# Patient Record
Sex: Female | Born: 1950
Health system: Southern US, Community
[De-identification: ages and names within clinical notes are randomized; demographics above are authoritative.]

## PROBLEM LIST (undated history)

## (undated) DIAGNOSIS — D352 Benign neoplasm of pituitary gland: Secondary | ICD-10-CM

## (undated) DIAGNOSIS — G373 Acute transverse myelitis in demyelinating disease of central nervous system: Secondary | ICD-10-CM

## (undated) DIAGNOSIS — I10 Essential (primary) hypertension: Secondary | ICD-10-CM

## (undated) DIAGNOSIS — M419 Scoliosis, unspecified: Secondary | ICD-10-CM

## (undated) DIAGNOSIS — M1990 Unspecified osteoarthritis, unspecified site: Secondary | ICD-10-CM

## (undated) DIAGNOSIS — IMO0002 Reserved for concepts with insufficient information to code with codable children: Secondary | ICD-10-CM

## (undated) DIAGNOSIS — G709 Myoneural disorder, unspecified: Secondary | ICD-10-CM

## (undated) DIAGNOSIS — M48061 Spinal stenosis, lumbar region without neurogenic claudication: Secondary | ICD-10-CM

## (undated) HISTORY — DX: Acute transverse myelitis in demyelinating disease of central nervous system: G37.3

## (undated) HISTORY — PX: ABDOMINAL HYSTERECTOMY: SHX81

## (undated) HISTORY — DX: Spinal stenosis, lumbar region without neurogenic claudication: M48.061

## (undated) HISTORY — DX: Benign neoplasm of pituitary gland: D35.2

## (undated) HISTORY — DX: Myoneural disorder, unspecified: G70.9

---

## 2002-09-09 DIAGNOSIS — G373 Acute transverse myelitis in demyelinating disease of central nervous system: Secondary | ICD-10-CM

## 2002-09-09 HISTORY — DX: Acute transverse myelitis in demyelinating disease of central nervous system: G37.3

## 2002-09-09 HISTORY — PX: TRANSPHENOIDAL PITUITARY RESECTION: SHX2572

## 2013-03-23 ENCOUNTER — Emergency Department (HOSPITAL_BASED_OUTPATIENT_CLINIC_OR_DEPARTMENT_OTHER): Payer: No Typology Code available for payment source

## 2013-03-23 ENCOUNTER — Encounter (HOSPITAL_BASED_OUTPATIENT_CLINIC_OR_DEPARTMENT_OTHER): Payer: Self-pay | Admitting: *Deleted

## 2013-03-23 ENCOUNTER — Emergency Department (HOSPITAL_BASED_OUTPATIENT_CLINIC_OR_DEPARTMENT_OTHER)
Admission: EM | Admit: 2013-03-23 | Discharge: 2013-03-23 | Disposition: A | Discharge status: Home / Self Care | Payer: No Typology Code available for payment source | Attending: Emergency Medicine | Admitting: Emergency Medicine

## 2013-03-23 DIAGNOSIS — E876 Hypokalemia: Secondary | ICD-10-CM | POA: Insufficient documentation

## 2013-03-23 DIAGNOSIS — R0789 Other chest pain: Secondary | ICD-10-CM | POA: Insufficient documentation

## 2013-03-23 DIAGNOSIS — Z79899 Other long term (current) drug therapy: Secondary | ICD-10-CM | POA: Insufficient documentation

## 2013-03-23 DIAGNOSIS — H539 Unspecified visual disturbance: Secondary | ICD-10-CM | POA: Insufficient documentation

## 2013-03-23 DIAGNOSIS — R0602 Shortness of breath: Secondary | ICD-10-CM | POA: Insufficient documentation

## 2013-03-23 DIAGNOSIS — I10 Essential (primary) hypertension: Secondary | ICD-10-CM | POA: Insufficient documentation

## 2013-03-23 DIAGNOSIS — Z8739 Personal history of other diseases of the musculoskeletal system and connective tissue: Secondary | ICD-10-CM | POA: Insufficient documentation

## 2013-03-23 HISTORY — DX: Essential (primary) hypertension: I10

## 2013-03-23 HISTORY — DX: Reserved for concepts with insufficient information to code with codable children: IMO0002

## 2013-03-23 HISTORY — DX: Scoliosis, unspecified: M41.9

## 2013-03-23 HISTORY — DX: Unspecified osteoarthritis, unspecified site: M19.90

## 2013-03-23 LAB — BASIC METABOLIC PANEL
Chloride: 104 mEq/L (ref 96–112)
GFR calc Af Amer: 68 mL/min — ABNORMAL LOW (ref >90)
GFR calc non Af Amer: 59 mL/min — ABNORMAL LOW (ref >90)
Potassium: 3.4 mEq/L — ABNORMAL LOW (ref 3.5–5.1)
Sodium: 144 mEq/L (ref 135–145)

## 2013-03-23 LAB — CBC WITH DIFFERENTIAL/PLATELET
Basophils Absolute: 0 10*3/uL (ref 0.0–0.1)
Basophils Relative: 1 % (ref 0–1)
Eosinophils Absolute: 0.2 10*3/uL (ref 0.0–0.7)
Hemoglobin: 12.6 g/dL (ref 12.0–15.0)
MCH: 29.5 pg (ref 26.0–34.0)
MCHC: 32 g/dL (ref 30.0–36.0)
Neutro Abs: 2.6 10*3/uL (ref 1.7–7.7)
Neutrophils Relative %: 43 % (ref 43–77)
Platelets: 204 10*3/uL (ref 150–400)
RDW: 13.3 % (ref 11.5–15.5)

## 2013-03-23 LAB — TROPONIN I: Troponin I: <0.3 ng/mL (ref <0.30)

## 2013-03-23 MED ORDER — ATENOLOL 25 MG PO TABS
50.0000 mg | ORAL_TABLET | Freq: Once | ORAL | Status: AC
  Administered 2013-03-23: 50 mg via ORAL
  Filled 2013-03-23: qty 2

## 2013-03-23 MED ORDER — POTASSIUM CHLORIDE CRYS ER 20 MEQ PO TBCR
40.0000 meq | EXTENDED_RELEASE_TABLET | Freq: Once | ORAL | Status: AC
  Administered 2013-03-23: 40 meq via ORAL
  Filled 2013-03-23: qty 2

## 2013-03-23 MED ORDER — ATENOLOL 50 MG PO TABS: 50.0000 mg | ORAL_TABLET | Freq: Every day | ORAL | Status: DC

## 2013-03-23 NOTE — ED Provider Notes (Signed)
History    CSN: [id number] Arrival date & time 03/23/13  1146  First MD Initiated Contact with Patient 03/23/13 1229     Chief Complaint  Patient presents with  . Hypertension   (Consider location/radiation/quality/duration/timing/severity/associated sxs/prior Treatment) HPI Comments: 62 y.o. Female with PMHx of HTN presents today with hypertension. She has been well-controlled with Atenolol, but has been out of her meds about a week. She has been "borrowing from a friend," as she has not been under a PCP for over a year due to insurance issues. Pt states she became worried when last night she began feeling some chest tightness, central chest, non radiating accompanied with some shortness of breath. She admits seeing what she describes as black floaters which worried her as well. These sx she describes from last night have since resolved. Pt denies headache, numbness, or focal deficits.   Patient is a 62 y.o. female presenting with hypertension.  Hypertension Associated symptoms include chest pain. Pertinent negatives include no diaphoresis, fever, headaches, nausea, neck pain, numbness, rash, vomiting or weakness.   Past Medical History  Diagnosis Date  . Hypertension   . Arthritis   . Bulging disc   . Scoliosis    History reviewed. No pertinent past surgical history. History reviewed. No pertinent family history. History  Substance Use Topics  . Smoking status: Never Smoker   . Smokeless tobacco: Not on file  . Alcohol Use: No   OB History   Grav Para Term Preterm Abortions TAB SAB Ect Mult Living                 Review of Systems  Constitutional: Negative for fever and diaphoresis.  HENT: Negative for neck pain and neck stiffness.   Eyes: Positive for visual disturbance.       Black floaters  Respiratory: Positive for shortness of breath. Negative for apnea and chest tightness.   Cardiovascular: Positive for chest pain. Negative for palpitations.  Gastrointestinal:  Negative for nausea, vomiting, diarrhea and constipation.  Genitourinary: Negative for dysuria.  Musculoskeletal: Negative for gait problem.  Skin: Negative for rash.  Neurological: Negative for dizziness, weakness, light-headedness, numbness and headaches.    Allergies  Review of patient's allergies indicates no known allergies.  Home Medications   Current Outpatient Rx  Name  Route  Sig  Dispense  Refill  . atenolol (TENORMIN) 50 MG tablet   Oral   Take 50 mg by mouth daily.          BP 196/89  Pulse 96  Temp(Src) 98.8 F (37.1 C) (Oral)  Resp 20  SpO2 96% Physical Exam  Nursing note and vitals reviewed. Constitutional: She is oriented to person, place, and time. She appears well-developed and well-nourished. No distress.  HENT:  Head: Normocephalic and atraumatic.  Eyes: Conjunctivae and EOM are normal.  Neck: Normal range of motion. Neck supple.  No meningeal signs  Cardiovascular: Normal rate, regular rhythm and normal heart sounds.  Exam reveals no gallop and no friction rub.   No murmur heard. Pulmonary/Chest: Effort normal and breath sounds normal. No respiratory distress. She has no wheezes. She has no rales. She exhibits no tenderness.  Abdominal: Soft. Bowel sounds are normal. She exhibits no distension. There is no tenderness. There is no rebound and no guarding.  Musculoskeletal: Normal range of motion. She exhibits no edema and no tenderness.  FROM to upper and lower extremities No step-offs noted on C-spine No tenderness to palpation of the spinous processes of the  C-spine, T-spine or L-spine Full range of motion of C-spine, T-spine or L-spine Mild tenderness to palpation of the paraspinous muscles   Neurological: She is alert and oriented to person, place, and time. No cranial nerve deficit.  Speech is clear and goal oriented, follows commands Sensation normal to light touch and two point discrimination Moves extremities without ataxia, coordination  intact Normal gait and balance Normal strength in upper and lower extremities bilaterally including dorsiflexion and plantar flexion, strong and equal grip strength   Skin: Skin is warm and dry. She is not diaphoretic. No erythema.    ED Course  Procedures (including critical care time)   Date: 03/23/2013  Rate: 71  Rhythm: normal sinus rhythm  QRS Axis: normal  Intervals: possible left atrial enlargement  ST/T Wave abnormalities: normal  Conduction Disutrbances:none  Narrative Interpretation:   Old EKG Reviewed: none available   Labs Reviewed  BASIC METABOLIC PANEL - Abnormal; Notable for the following:    Potassium 3.4 (*)    Glucose, Bld 105 (*)    Calcium 11.0 (*)    GFR calc non Af Amer 59 (*)    GFR calc Af Amer 68 (*)    All other components within normal limits  TROPONIN I  CBC WITH DIFFERENTIAL   No results found. 1. Hypertension   2. Hypokalemia    Filed Vitals:   03/23/13 1202 03/23/13 1430  BP: 196/89 177/74  Pulse: 96 59  Temp: 98.8 F (37.1 C)   TempSrc: Oral   Resp: 20 20  SpO2: 96% 99%     MDM  Patient noted to be hypertensive in the emergency department.  Atenolol given and BP has been coming down incrementally. No signs of hypertensive urgency.  PE is benign, neuro exam is normal, lungs CTA, equal full expansion. EKG without acute abnormalities, negative troponin, and negative CXR. Labs revealed hypokalemia and the need for follow up was discussed with the pt. Discussed with pt that presentation of symptoms, tests and imaging performed today are reassuring to rule out acute coronary syndrome, pneumothorax, aortic dissection.   Prescription for atenolol written and impressed upon the pt the importance of having a physical and establishing herself with a primary care physician. Pt stated that she was in the middle of insurance issues and was planning on doing just that. Return precautions discussed including if CP becomes exertional, associated with  diaphoresis or nausea, radiates to left jaw/arm, worsens or becomes concerning in any way. Patient expresses understanding and agrees with plan.     Glade Nurse, PA-C 03/23/13 1549

## 2013-03-23 NOTE — ED Notes (Signed)
Blood pressure up today  Pt states she thought she would mention she has a pain behind her left ear that has been there for years just hasnt had checked out

## 2013-03-24 NOTE — ED Provider Notes (Signed)
Medical screening examination/treatment/procedure(s) were performed by non-physician practitioner and as supervising physician I was immediately available for consultation/collaboration.  Ladarryl Wrage, MD 03/24/13 1449 

## 2013-06-03 ENCOUNTER — Other Ambulatory Visit: Payer: Self-pay | Admitting: *Deleted

## 2013-06-07 ENCOUNTER — Ambulatory Visit (INDEPENDENT_AMBULATORY_CARE_PROVIDER_SITE_OTHER): Payer: No Typology Code available for payment source | Admitting: Internal Medicine

## 2013-06-07 ENCOUNTER — Encounter: Payer: Self-pay | Admitting: Internal Medicine

## 2013-06-07 ENCOUNTER — Telehealth: Payer: Self-pay | Admitting: *Deleted

## 2013-06-07 VITALS — BP 212/110 | HR 86 | Temp 97.5°F | Resp 20 | Wt 308.0 lb

## 2013-06-07 DIAGNOSIS — M48061 Spinal stenosis, lumbar region without neurogenic claudication: Secondary | ICD-10-CM

## 2013-06-07 DIAGNOSIS — G0489 Other myelitis: Secondary | ICD-10-CM

## 2013-06-07 DIAGNOSIS — I10 Essential (primary) hypertension: Secondary | ICD-10-CM

## 2013-06-07 DIAGNOSIS — Z23 Encounter for immunization: Secondary | ICD-10-CM

## 2013-06-07 DIAGNOSIS — Z90710 Acquired absence of both cervix and uterus: Secondary | ICD-10-CM

## 2013-06-07 DIAGNOSIS — G373 Acute transverse myelitis in demyelinating disease of central nervous system: Secondary | ICD-10-CM | POA: Insufficient documentation

## 2013-06-07 DIAGNOSIS — Z86711 Personal history of pulmonary embolism: Secondary | ICD-10-CM

## 2013-06-07 LAB — CBC WITH DIFFERENTIAL/PLATELET
Basophils Absolute: 0 10*3/uL (ref 0.0–0.1)
Basophils Relative: 0 % (ref 0–1)
HCT: 38.1 % (ref 36.0–46.0)
Hemoglobin: 12.7 g/dL (ref 12.0–15.0)
Lymphocytes Relative: 36 % (ref 12–46)
MCHC: 33.3 g/dL (ref 30.0–36.0)
Monocytes Absolute: 0.6 10*3/uL (ref 0.1–1.0)
Monocytes Relative: 12 % (ref 3–12)
Neutro Abs: 2.6 10*3/uL (ref 1.7–7.7)
Neutrophils Relative %: 49 % (ref 43–77)
WBC: 5.3 10*3/uL (ref 4.0–10.5)

## 2013-06-07 LAB — LIPID PANEL
Cholesterol: 240 mg/dL — ABNORMAL HIGH (ref 0–200)
HDL: 49 mg/dL (ref >39)
LDL Cholesterol: 169 mg/dL — ABNORMAL HIGH (ref 0–99)
Triglycerides: 109 mg/dL (ref <150)
VLDL: 22 mg/dL (ref 0–40)

## 2013-06-07 MED ORDER — CLONIDINE HCL 0.1 MG PO TABS
0.1000 mg | ORAL_TABLET | Freq: Once | ORAL | Status: AC
  Administered 2013-06-07: 0.1 mg via ORAL

## 2013-06-07 MED ORDER — LOSARTAN POTASSIUM-HCTZ 100-25 MG PO TABS: 1.0000 | ORAL_TABLET | Freq: Every day | ORAL | Status: DC

## 2013-06-07 MED ORDER — NABUMETONE 500 MG PO TABS: 500.0000 mg | ORAL_TABLET | Freq: Two times a day (BID) | ORAL | Status: DC

## 2013-06-07 NOTE — Patient Instructions (Addendum)
Take blood pressure  Pill every day  (Hyzaar)  It has a diuretic in it  See me in 2 weeks  30 min appt     Will refer to neurology  Call us back with the name of neurologist who you have seen

## 2013-06-07 NOTE — Telephone Encounter (Signed)
Pt called with name of neurologist Barnett Hatter.

## 2013-06-07 NOTE — Progress Notes (Signed)
Subjective:    Patient ID: Melanie Orr, female    DOB: 11/20/1950, 62 y.o.   MRN: [id number]  HPI Melanie Orr is here new pt for first visit.   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    PMH of long standing HTN,  DJD back and neck, spinal stenosis  Uterine prolapse S/P hysterectomy,  PE S/P venous filter, morbid obesity,  She also give history of transverse myelitis and has seen a neurologist in [place] for this.  She cannot recall name.  She does have chronic back issues and has received steroid injections in the past.    She was seen in ER back in July and given atenolol for her BP.  She has run out of meds for at least one week.  She has occasional dizziness and pain in neck.  NO chest pain no SOB.  She does "hold fluid in my legs"  See calcium level    No Known Allergies Past Medical History  Diagnosis Date  . Hypertension   . Arthritis   . Bulging disc   . Scoliosis    No past surgical history on file. History   Social History  . Marital Status: Single    Spouse Name: N/A    Number of Children: N/A  . Years of Education: N/A   Occupational History  . Not on file.   Social History Main Topics  . Smoking status: Never Smoker   . Smokeless tobacco: Not on file  . Alcohol Use: No  . Drug Use: No  . Sexual Activity: Not on file   Other Topics Concern  . Not on file   Social History Narrative  . No narrative on file   No family history on file. Patient Active Problem List   Diagnosis Date Noted  . HTN (hypertension) 06/07/2013   Current Outpatient Prescriptions on File Prior to Visit  Medication Sig Dispense Refill  . atenolol (TENORMIN) 50 MG tablet Take 50 mg by mouth daily.      Marland Kitchen atenolol (TENORMIN) 50 MG tablet Take 1 tablet (50 mg total) by mouth daily.  30 tablet  1   No current facility-administered medications on file prior to visit.     Review of Systems    see HPI Objective:   Physical Exam Physical Exam  Nursing note and vitals reviewed.    REpeat BP  186/100 after clonidine Constitutional: She is oriented to person, place, and time. She appears well-developed and well-nourished.  HENT:  Head: Normocephalic and atraumatic.  Cardiovascular: Normal rate and regular rhythm. Exam reveals no gallop and no friction rub.  No murmur heard.  Pulmonary/Chest: Breath sounds normal. She has no wheezes. She has no rales.  Neurological: She is alert and oriented to person, place, and time.  Skin: Skin is warm and dry.  EXt  1+ edema extremities Psychiatric: She has a normal mood and affect. Her behavior is normal.          Assessment & Plan:  HTN uncontrolled:  Clonidine 0.1 mg in office and will start on Hyzaar 100/25 daily.  Hold atenolol for now  Spinal stenosis/DJD/??? History of transverse myelitis    I have asked pt to check records at home to see which neurologist she has seen in the past and we will refer her back to the group.  Ok for Relafen bid for now  Elevated Calcium  Will rehceck today with PTH  History  of PE  Will need old records  Morbid obesity  See me in  2 weeks

## 2013-06-08 LAB — PTH, INTACT AND CALCIUM
Calcium: 9.2 mg/dL (ref 8.4–10.5)
PTH: 67.7 pg/mL (ref 14.0–72.0)

## 2013-06-21 ENCOUNTER — Encounter: Payer: Self-pay | Admitting: Internal Medicine

## 2013-06-21 ENCOUNTER — Ambulatory Visit (INDEPENDENT_AMBULATORY_CARE_PROVIDER_SITE_OTHER): Payer: No Typology Code available for payment source | Admitting: Internal Medicine

## 2013-06-21 VITALS — BP 192/98 | HR 79 | Temp 97.4°F | Resp 18 | Wt 306.0 lb

## 2013-06-21 DIAGNOSIS — G373 Acute transverse myelitis in demyelinating disease of central nervous system: Secondary | ICD-10-CM

## 2013-06-21 DIAGNOSIS — M549 Dorsalgia, unspecified: Secondary | ICD-10-CM

## 2013-06-21 DIAGNOSIS — E785 Hyperlipidemia, unspecified: Secondary | ICD-10-CM

## 2013-06-21 DIAGNOSIS — R3989 Other symptoms and signs involving the genitourinary system: Secondary | ICD-10-CM

## 2013-06-21 DIAGNOSIS — R399 Unspecified symptoms and signs involving the genitourinary system: Secondary | ICD-10-CM

## 2013-06-21 DIAGNOSIS — I10 Essential (primary) hypertension: Secondary | ICD-10-CM

## 2013-06-21 LAB — COMPREHENSIVE METABOLIC PANEL
ALT: 16 U/L (ref 0–35)
AST: 19 U/L (ref 0–37)
Albumin: 3.8 g/dL (ref 3.5–5.2)
Alkaline Phosphatase: 62 U/L (ref 39–117)
Calcium: 9.9 mg/dL (ref 8.4–10.5)
Chloride: 103 mEq/L (ref 96–112)
Potassium: 3.9 mEq/L (ref 3.5–5.3)
Sodium: 142 mEq/L (ref 135–145)
Total Protein: 6.5 g/dL (ref 6.0–8.3)

## 2013-06-21 LAB — POCT URINALYSIS DIPSTICK
Bilirubin, UA: NEGATIVE
Glucose, UA: NEGATIVE
Nitrite, UA: NEGATIVE
Urobilinogen, UA: NEGATIVE

## 2013-06-21 MED ORDER — LABETALOL HCL 100 MG PO TABS: 100.0000 mg | ORAL_TABLET | Freq: Two times a day (BID) | ORAL | Status: DC

## 2013-06-21 MED ORDER — ACETAMINOPHEN-CODEINE #3 300-30 MG PO TABS: ORAL_TABLET | ORAL | Status: DC

## 2013-06-21 NOTE — Patient Instructions (Addendum)
Take your Hyzaar and labetalol every  morning  Take your labetalol 100 mg every evening  Will refer you to Dr. Mariel Craft  And a medical neurologist  See me next Thursday 30 min appt

## 2013-06-22 ENCOUNTER — Encounter: Payer: Self-pay | Admitting: *Deleted

## 2013-06-27 DIAGNOSIS — E785 Hyperlipidemia, unspecified: Secondary | ICD-10-CM | POA: Insufficient documentation

## 2013-06-27 NOTE — Progress Notes (Signed)
Subjective:    Patient ID: Melanie Orr, female    DOB: Aug 10, 1951, 62 y.o.   MRN: [id number]  HPI  Jerney is here for follow up.  She is here with her daughter.  See BP  She tells me she is taking her Hyzaar but she notes she is not urinating very much despite the HCTZ .     She tells me she is having worsening back pain from her past history of spinal stenosis and transverse myelitis.   Relafen not helping much She knows she has seen Dr. Mariel Craft in the past.    She is walking with a walker today.  She denies leg weakness or bowel/bladder incontinence  Hypercalcemia  Repeat CAlcium and PTH are both normal  Hyperlipidemia  She has not been on much for this  She has been having urinary frequency  No pain   No Known Allergies Past Medical History  Diagnosis Date  . Hypertension   . Arthritis   . Bulging disc   . Scoliosis   . Neuromuscular disorder   . Transverse myelitis    Past Surgical History  Procedure Laterality Date  . Tumor removal    . Abdominal hysterectomy     History   Social History  . Marital Status: Single    Spouse Name: N/A    Number of Children: N/A  . Years of Education: N/A   Occupational History  . Not on file.   Social History Main Topics  . Smoking status: Former Smoker    Quit date: 06/08/1979  . Smokeless tobacco: Never Used  . Alcohol Use: No  . Drug Use: No  . Sexual Activity: No   Other Topics Concern  . Not on file   Social History Narrative  . No narrative on file   Family History  Problem Relation Age of Onset  . Hypertension Mother   . Heart disease Mother   . Hypertension Sister   . Hypertension Brother    Patient Active Problem List   Diagnosis Date Noted  . HTN (hypertension) 06/07/2013  . Hypercalcemia 06/07/2013  . Transverse myelitis 06/07/2013  . S/P hysterectomy 06/07/2013  . History of pulmonary embolus (PE) 06/07/2013  . Spinal stenosis of lumbar region 06/07/2013  . Morbid obesity 06/07/2013    Current Outpatient Prescriptions on File Prior to Visit  Medication Sig Dispense Refill  . losartan-hydrochlorothiazide (HYZAAR) 100-25 MG per tablet Take 1 tablet by mouth daily.  30 tablet  3  . nabumetone (RELAFEN) 500 MG tablet Take 1 tablet (500 mg total) by mouth 2 (two) times daily.  30 tablet  1   No current facility-administered medications on file prior to visit.       Review of Systems See HPI    Objective:   Physical Exam Physical Exam  Nursing note and vitals reviewed.  Constitutional: She is oriented to person, place, and time. She appears well-developed and well-nourished.  HENT:  Head: Normocephalic and atraumatic.  Cardiovascular: Normal rate and regular rhythm. Exam reveals no gallop and no friction rub.  No murmur heard.  Pulmonary/Chest: Breath sounds normal. She has no wheezes. She has no rales.  Neurological: She is alert and oriented to person, place, and time.  Limted exam of LE  Reflexes 2+ symmetric Motor  5/5 muscle groups LE Sensory intact to microfilament LE Skin: Skin is warm and dry.  Psychiatric: She has a normal mood and affect. Her behavior is normal.  Assessment & Plan:  HTN  EKG today  Nonspecific ST-T changes will add labetalol 100 bid   She is to see me next week/  If BP still not controlled will need stronger diuretic  Back pain  Spinal stenosis/history of transverse myelitis  Will stop relafen and take T#3 1 or two q6h   ADvised colace and miralax for constipation.  She has seen Dr. Mariel Craft in the past will refer to him  Hyperlipidemia  Will discuss treatment when BP under better control  Urinary frequency  Normal U/A today  Morbid obesity     See  Me in one week

## 2013-06-30 ENCOUNTER — Ambulatory Visit (INDEPENDENT_AMBULATORY_CARE_PROVIDER_SITE_OTHER): Payer: No Typology Code available for payment source | Admitting: Internal Medicine

## 2013-06-30 ENCOUNTER — Encounter: Payer: Self-pay | Admitting: Internal Medicine

## 2013-06-30 VITALS — BP 185/89 | HR 81 | Temp 98.1°F | Resp 20 | Wt 306.0 lb

## 2013-06-30 DIAGNOSIS — I10 Essential (primary) hypertension: Secondary | ICD-10-CM

## 2013-06-30 DIAGNOSIS — G0489 Other myelitis: Secondary | ICD-10-CM

## 2013-06-30 DIAGNOSIS — M48061 Spinal stenosis, lumbar region without neurogenic claudication: Secondary | ICD-10-CM

## 2013-06-30 DIAGNOSIS — G373 Acute transverse myelitis in demyelinating disease of central nervous system: Secondary | ICD-10-CM

## 2013-06-30 NOTE — Patient Instructions (Signed)
See me 6 weeks  Schedule CPE  Will refer to neurology

## 2013-06-30 NOTE — Progress Notes (Signed)
Subjective:    Patient ID: Melanie Orr, female    DOB: Sep 22, 1950, 62 y.o.   MRN: [id number]  HPI  Melanie Orr is here for follow up  Unfortunately  She has missed several days of her BP meds.  A relative has her purse that has her meds in it.  She is asymptomatic.    She has a history of transverse myelitis and spinal stenosis and was hospitalized at [facility] 5-6 years ago.  She cannot recall what neurologist cared for her.  She does have back discomfort off an on  No Known Allergies Past Medical History  Diagnosis Date  . Hypertension   . Arthritis   . Bulging disc   . Scoliosis   . Neuromuscular disorder   . Transverse myelitis    Past Surgical History  Procedure Laterality Date  . Tumor removal    . Abdominal hysterectomy     History   Social History  . Marital Status: Single    Spouse Name: N/A    Number of Children: N/A  . Years of Education: N/A   Occupational History  . Not on file.   Social History Main Topics  . Smoking status: Former Smoker    Quit date: 06/08/1979  . Smokeless tobacco: Never Used  . Alcohol Use: No  . Drug Use: No  . Sexual Activity: No   Other Topics Concern  . Not on file   Social History Narrative  . No narrative on file   Family History  Problem Relation Age of Onset  . Hypertension Mother   . Heart disease Mother   . Hypertension Sister   . Hypertension Brother    Patient Active Problem List   Diagnosis Date Noted  . Hyperlipidemia 06/27/2013  . HTN (hypertension) 06/07/2013  . Hypercalcemia 06/07/2013  . Transverse myelitis 06/07/2013  . S/P hysterectomy 06/07/2013  . History of pulmonary embolus (PE) 06/07/2013  . Spinal stenosis of lumbar region 06/07/2013  . Morbid obesity 06/07/2013   Current Outpatient Prescriptions on File Prior to Visit  Medication Sig Dispense Refill  . acetaminophen-codeine (TYLENOL #3) 300-30 MG per tablet Take one or two tablets q6h prn pain  30 tablet  0  .  losartan-hydrochlorothiazide (HYZAAR) 100-25 MG per tablet Take 1 tablet by mouth daily.  30 tablet  3  . labetalol (NORMODYNE) 100 MG tablet Take 1 tablet (100 mg total) by mouth 2 (two) times daily.  60 tablet  1   No current facility-administered medications on file prior to visit.      Review of Systems See HPI    Objective:   Physical Exam  Physical Exam  Nursing note and vitals reviewed.  Constitutional: She is oriented to person, place, and time. She appears well-developed and well-nourished.  HENT:  Head: Normocephalic and atraumatic.  Cardiovascular: Normal rate and regular rhythm. Exam reveals no gallop and no friction rub.  No murmur heard.  Pulmonary/Chest: Breath sounds normal. She has no wheezes. She has no rales.  Neurological: She is alert and oriented to person, place, and time.  Skin: Skin is warm and dry.  Psychiatric: She has a normal mood and affect. Her behavior is normal.         Assessment & Plan:  HTN:  Uncontrolled  But pt has not had her meds for several days.    She is to see me in 4-6 weeks.  Schedule CPe  Hyperlipidemia:  She does not want meds at this time.  Will discuss  At future visit   History of transverse myelitis/spinal stenosis :  Will refer to neurology  See me 4-6 weeks

## 2013-07-04 ENCOUNTER — Encounter: Payer: Self-pay | Admitting: Internal Medicine

## 2013-07-04 DIAGNOSIS — Z87898 Personal history of other specified conditions: Secondary | ICD-10-CM | POA: Insufficient documentation

## 2013-07-04 DIAGNOSIS — G4733 Obstructive sleep apnea (adult) (pediatric): Secondary | ICD-10-CM | POA: Insufficient documentation

## 2013-07-04 DIAGNOSIS — K219 Gastro-esophageal reflux disease without esophagitis: Secondary | ICD-10-CM | POA: Insufficient documentation

## 2013-07-04 DIAGNOSIS — Z86718 Personal history of other venous thrombosis and embolism: Secondary | ICD-10-CM | POA: Insufficient documentation

## 2013-07-08 ENCOUNTER — Encounter: Payer: Self-pay | Admitting: *Deleted

## 2013-07-14 ENCOUNTER — Ambulatory Visit: Payer: No Typology Code available for payment source | Admitting: Internal Medicine

## 2013-07-26 ENCOUNTER — Encounter: Payer: Self-pay | Admitting: Neurology

## 2013-07-26 ENCOUNTER — Ambulatory Visit: Payer: No Typology Code available for payment source | Admitting: Neurology

## 2013-08-09 ENCOUNTER — Ambulatory Visit: Payer: No Typology Code available for payment source | Admitting: Internal Medicine

## 2013-08-09 DIAGNOSIS — Z09 Encounter for follow-up examination after completed treatment for conditions other than malignant neoplasm: Secondary | ICD-10-CM

## 2013-08-23 ENCOUNTER — Encounter: Payer: Self-pay | Admitting: Neurology

## 2013-08-23 ENCOUNTER — Ambulatory Visit (INDEPENDENT_AMBULATORY_CARE_PROVIDER_SITE_OTHER): Payer: No Typology Code available for payment source | Admitting: Neurology

## 2013-08-23 VITALS — BP 130/80 | HR 80 | Temp 97.8°F | Ht 67.0 in | Wt 300.0 lb

## 2013-08-23 DIAGNOSIS — M48062 Spinal stenosis, lumbar region with neurogenic claudication: Secondary | ICD-10-CM

## 2013-08-23 DIAGNOSIS — G373 Acute transverse myelitis in demyelinating disease of central nervous system: Secondary | ICD-10-CM

## 2013-08-23 DIAGNOSIS — G0489 Other myelitis: Secondary | ICD-10-CM

## 2013-08-23 MED ORDER — METHOCARBAMOL 500 MG PO TABS: 500.0000 mg | ORAL_TABLET | Freq: Three times a day (TID) | ORAL | Status: DC | PRN

## 2013-08-23 NOTE — Patient Instructions (Addendum)
1.  MRI thoracic and lumbar spine wwo contrast December 24,2014 at [facility] [address][id number] 2.  Start taking robaxin 500mg  take one tablet at bedtime.  If tolerating, you can take one tablet every 8 hours as needed for muscle spasms 3.  Return to clinic 92-month

## 2013-08-23 NOTE — Progress Notes (Signed)
[facility] Neurology Division Clinic Note - Initial Visit   Date: 08/23/2013    Melanie Orr MRN: [id number] DOB: 1951/04/26   Dear Dr Constance Goltz:  Thank you for your kind referral of Melanie Orr for consultation of transverse myelitis. Although her history is well known to you, please allow [place] to reiterate it for the purpose of our medical record. The patient was accompanied to the clinic by daughter.   History of Present Illness: Melanie Orr is a 61 y.o. year-old right-handed [place] American female with history of hypertension, transverse myelitis, situational PE s/p IVC filter, lumbar spinal stenosis, pituitary mass s/p resection, presenting for evaluation of history of transverse myelitis.  She had been without health insurance for a number of years and in September 2014, she established care with Dr. Constance Goltz.  During that evaluation, she had mentioned history of spinal stensis and transverse myelitis for which she was referred to neurology.  In 2004, she woke up one morning with generalized weakness involving the right side.  She eventually became plegic with her right leg, developed bowel/bladder incontinence, and numbness over the right leg.  She underwent imaging and CSF testing which was consistent transverse myelitis. No underlying etiology was found.  She was discharged to rehab where she developed a new headache.  MRI brain shows pituitary ademoma which she had removed at [facility]. She also developed a situational DVT with PE s/p IVC filter and was anticoagulation.  She was discharged from rehab in a wheelchair and completed out-patient therapy where she was able to eventually walk with a cane within a year.   She started developing low back pain soon after regaining the ability to ambulate independently.  Pain is described tightness at rest and transitions into a sharp, achy pain when she tried to walk and prolonged standing. Denies any  radiation of her pain.  Pain is relieved when sitting.  She says that leaning onto things, such as a shopping cart, completely resolves the pain. Even when she is washing dishes, she has to lean on her elbows to minimize the pain. In addition to low back pain, she complains of lower extremity muscle spasms. She has seen Dr. Darden Palmer and another neurologist in [facility] for back pain.  She has tried flexiril and percocet for pain with minimal relief.  She has also spinal injections which helped for one week.  About 2-years ago, she started using a walker. She denies any recent falls. No numbness/tingling or weakness.  There is no history of acute vision loss.  Out-side paper records, electronic medical record, and images have been reviewed where available and summarized as:  MRI brain November 20 2002:  There is a mass arising from the sella with apparent extension of tumor into the cavernous sinus on the right. MRI brain 12/14/02: Pituitary mass extending superset and into the right cavernous sinus. Mass appears to increase in size since 11/20/2002.  Lab Results  Component Value Date   TSH 0.431 06/07/2013     Past Medical History  Diagnosis Date  . Hypertension   . Arthritis   . Bulging disc   . Scoliosis   . Neuromuscular disorder   . Transverse myelitis 2004  . Spinal stenosis of lumbar region   . Pituitary adenoma     Past Surgical History  Procedure Laterality Date  . Transphenoidal pituitary resection  2004  . Abdominal hysterectomy       Medications:  Current Outpatient Prescriptions on File Prior to Visit  Medication Sig Dispense Refill  . losartan-hydrochlorothiazide (HYZAAR) 100-25 MG per tablet Take 1 tablet by mouth daily.  30 tablet  3   No current facility-administered medications on file prior to visit.    Allergies:  Allergies  Allergen Reactions  . Penicillins Itching    Family History: Family History  Problem Relation Age of Onset  . Hypertension Mother     . Heart disease Mother     Deceased, 4  . Hypertension Sister   . Hypertension Brother   . Healthy Daughter   . Other Father     Deceased, 18s    Social History: History   Social History  . Marital Status: Single    Spouse Name: N/A    Number of Children: N/A  . Years of Education: N/A   Occupational History  . Not on file.   Social History Main Topics  . Smoking status: Former Smoker    Quit date: 06/08/1979  . Smokeless tobacco: Never Used  . Alcohol Use: Yes     Comment: Occasionally drink wine  . Drug Use: No  . Sexual Activity: No   Other Topics Concern  . Not on file   Social History Narrative   Lives with daughter in a [place] home.   Single.  Two children.   Early retirement due to back problems in 2012.     Previously worked as a Science writer for [employer].    Review of Systems:  CONSTITUTIONAL: No fevers, chills, night sweats, orweight loss.   EYES: No visual changes or eye pain ENT: No hearing changes.  No history of nose bleeds.   RESPIRATORY: No cough, wheezing and shortness of breath.   CARDIOVASCULAR: Negative for chest pain, and palpitations.   GI: Negative for abdominal discomfort, blood in stools or black stools.  No recent change in bowel habits.   GU:  No history of incontinence.   MUSCLOSKELETAL: No history of joint pain or swelling.  + myalgias.   SKIN: Negative for lesions, rash, and itching.   HEMATOLOGY/ONCOLOGY: Negative for prolonged bleeding, bruising easily, and swollen nodes.   ENDOCRINE: Negative for cold or heat intolerance, polydipsia or goiter.   PSYCH:  No depression or anxiety symptoms.   NEURO: As Above.   Vital Signs:  BP 130/80  Pulse 80  Temp(Src) 97.8 F (36.6 C)  Ht 5\' 7"  (1.702 m)  Wt 300 lb (136.079 kg)  BMI 46.98 kg/m2   General Medical Exam:   General:  Obese, well appearing, comfortable.   Eyes/ENT: see cranial nerve examination.   Neck: No masses appreciated.  Full range of motion  without tenderness.   Back:  No pain to palpation of spinous processes.   Extremities:  No deformities, edema, or skin discoloration.   Skin:  Skin color, texture, turgor normal. No rashes or lesions.  Neurological Exam: MENTAL STATUS including orientation to time, place, person, recent and remote memory, attention span and concentration, language, and fund of knowledge is normal.  Speech is not dysarthric.  CRANIAL NERVES: II:  No visual field defects.  Unremarkable fundi.   III-IV-VI: Pupils equal round and reactive to light.  Normal conjugate, extra-ocular eye movements in all directions of gaze.  No nystagmus.  No ptosis V:  Normal facial sensation.    VII:  Normal facial symmetry and movements.   VIII:  Normal hearing and vestibular function.   IX-X:  Normal palatal movement.   XI:  Normal shoulder shrug and head rotation.  XII:  Normal tongue strength and range of motion, no deviation or fasciculation.  MOTOR:  No atrophy, fasciculations or abnormal movements.  No pronator drift.    Right Upper Extremity:    Left Upper Extremity:    Deltoid  5/5   Deltoid  5/5   Biceps  5/5   Biceps  5/5   Triceps  5/5   Triceps  5/5   Wrist extensors  5/5   Wrist extensors  5/5   Wrist flexors  5/5   Wrist flexors  5/5   Finger extensors  5/5   Finger extensors  5/5   Finger flexors  5/5   Finger flexors  5/5   Dorsal interossei  5/5   Dorsal interossei  5/5   Abductor pollicis  5/5   Abductor pollicis  5/5   Tone (Ashworth scale)  0  Tone (Ashworth scale)  0   Right Lower Extremity:    Left Lower Extremity:    Hip flexors  5/5   Hip flexors  5/5   Hip extensors  5/5   Hip extensors  5/5   Knee flexors  5/5   Knee flexors  5/5   Knee extensors  5/5   Knee extensors  5/5   Dorsiflexors  5/5   Dorsiflexors  5/5   Plantarflexors  5/5   Plantarflexors  5/5   Toe extensors  5/5   Toe extensors  5/5   Toe flexors  5/5   Toe flexors  5/5   Tone (Ashworth scale)  1  Tone (Ashworth scale)  1     MSRs:  Right                                                                 Left brachioradialis 1+  brachioradialis 2+  biceps 1+  biceps 2+  triceps 1+  triceps 2+  patellar 0  Patellar 0  ankle jerk 0  ankle jerk 0  Hoffman no  Hoffman no  plantar response up  plantar response up  No clonus.  SENSORY:  Normal and symmetric perception of light touch, pinprick, vibration, and proprioception.   COORDINATION/GAIT: Normal finger-to- nose-finger and heel-to-shin.  Intact rapid alternating movements bilaterally.  Unable to rise from a chair without using arms.  Gait mildly wide-based and stable. She is unable to perform tandem and stressed gait.   IMPRESSION: Ms. Hickle is a 62 year-old female presenting for evaluation of low back pain.  Her neurological examination shows increased tone in her legs, extensor plantar response, with absent reflexes.  She has features of foraminal stenosis (absent reflexes) and myelopathy (increased tone, extensor plantar response) on exam.  I would like to obtain a MRI thoracic and lumbar spine to evaluate for structural abnormalities (spinal stenosis) as well as any abnormalities intrinsic to the spinal cord (history of transverse myelitis).  Based on her history, I suspect that the more active process contributing to her symptoms is due to neurogenic claudication from spinal stenosis.  For symptomatic control, I will start her on a muscle relaxant for muscle spasms.  Based on the results of her imaging, will consider PT going forward.    PLAN/RECOMMENDATIONS:  1.  MRI thoracic and lumbar spine wwo contrast  2.  Start taking robaxin  500mg  take one tablet at bedtime.  If tolerating, take one tablet every 8 hours as needed for muscle spasms 3.  Return to clinic 60-month   The duration of this appointment visit was 45 minutes of face-to-face time with the patient.  Greater than 50% of this time was spent in counseling, explanation of diagnosis, planning of  further management, and coordination of care.   Thank you for allowing me to participate in patient's care.  If I can answer any additional questions, I would be pleased to do so.    Sincerely,    Donika K. Allena Katz, DO

## 2013-09-01 ENCOUNTER — Ambulatory Visit
Admission: RE | Admit: 2013-09-01 | Discharge: 2013-09-01 | Disposition: A | Discharge status: Home / Self Care | Payer: No Typology Code available for payment source | Source: Ambulatory Visit | Attending: Neurology | Admitting: Neurology

## 2013-09-01 DIAGNOSIS — M48062 Spinal stenosis, lumbar region with neurogenic claudication: Secondary | ICD-10-CM

## 2013-09-01 DIAGNOSIS — G373 Acute transverse myelitis in demyelinating disease of central nervous system: Secondary | ICD-10-CM

## 2013-09-01 MED ORDER — GADOBENATE DIMEGLUMINE 529 MG/ML IV SOLN
20.0000 mL | Freq: Once | INTRAVENOUS | Status: AC | PRN
  Administered 2013-09-01: 20 mL via INTRAVENOUS

## 2013-09-10 ENCOUNTER — Telehealth: Payer: Self-pay | Admitting: Neurology

## 2013-09-10 NOTE — Telephone Encounter (Signed)
I attempted to contact patient via phone today regarding the results of MRI thoracic and lumbar spine, however there was no answer so a message was left for the patient to return my call.   Donika K. Posey Pronto, DO

## 2013-09-20 ENCOUNTER — Encounter: Payer: Self-pay | Admitting: Neurology

## 2013-09-20 ENCOUNTER — Ambulatory Visit (INDEPENDENT_AMBULATORY_CARE_PROVIDER_SITE_OTHER): Payer: No Typology Code available for payment source | Admitting: Neurology

## 2013-09-20 VITALS — BP 142/84 | HR 76 | Temp 98.1°F | Ht 67.0 in | Wt 301.0 lb

## 2013-09-20 DIAGNOSIS — M48062 Spinal stenosis, lumbar region with neurogenic claudication: Secondary | ICD-10-CM

## 2013-09-20 DIAGNOSIS — M5380 Other specified dorsopathies, site unspecified: Secondary | ICD-10-CM

## 2013-09-20 DIAGNOSIS — M62830 Muscle spasm of back: Secondary | ICD-10-CM

## 2013-09-20 MED ORDER — BACLOFEN 10 MG PO TABS: 10.0000 mg | ORAL_TABLET | Freq: Three times a day (TID) | ORAL | Status: DC

## 2013-09-20 MED ORDER — NAPROXEN 500 MG PO TABS: 500.0000 mg | ORAL_TABLET | Freq: Two times a day (BID) | ORAL | Status: DC

## 2013-09-20 NOTE — Patient Instructions (Addendum)
1.  Stop robaxin 2.  Start taking baclofen 10mg  at bedtime for 3 days.  If tolerating, start taking one tablet twice daily x 3 days, then take one tablet three times daily. 3.  For severe pain, take naprosyn 500mg  with food, try to limit no more than three times weekly. 4.  Start physical therapy  5.  Please send me a MyChart message to let me know how you are tolerating the baclofen 6.  Return to clinic in 51-months

## 2013-09-20 NOTE — Progress Notes (Signed)
Follow-up Visit   Date: 09/20/2013    Melanie Orr MRN: [id number] DOB: [birth year]   Interim History: Melanie Orr is a 63 y.o. right-handed African American female with history of hypertension, transverse myelitis, situational PE s/p IVC filter, lumbar spinal stenosis, pituitary mass s/p resection, returning to the clinic for low back pain and muscle spasms.  She was last seen in the office on 08/23/2013.  The patient was accompanied to the clinic by daughter who also provides collateral information.    History of present illness:  In 2004, she woke up one morning with generalized weakness involving the right side. She eventually became plegic with her right leg, developed bowel/bladder incontinence, and right leg numbness. Imaging and CSF testing was consistent transverse myelitis of unknown etiology.  She was discharged to rehab where she developed a new headache. MRI brain shows pituitary ademoma which she had removed at [facility]. She also developed a situational DVT with PE s/p IVC filter and was anticoagulation. She was discharged from rehab in a wheelchair and completed out-patient therapy where she was able to eventually walk with a cane within a year.   She started developing low back pain soon after regaining the ability to ambulate independently. Pain is described tightness at rest and transitions into a sharp, achy pain when she tried to walk and prolonged standing. Denies any radiation of her pain. Pain is relieved when sitting. She says that leaning onto things, such as a shopping cart, completely resolves the pain. Even when she is washing dishes, she has to lean on her elbows to minimize the pain. In addition to low back pain, she complains of lower extremity muscle spasms. She has seen Dr. Caryl Comes and another neurologist in [facility] for back pain. She has tried flexiril and percocet for pain with minimal relief. She has also spinal injections which  helped for one week. About 2-years ago, she started using a walker.   Interval history 09/20/2013:  There has been no change in symptoms after started robaxin and titrating it.  She continues to have proximal leg muscle spasms and back pain with prolonged standing/sitting. Back pain is relieved by naproxsyn 220mg , but relief is short-lived.  No recent falls, hospitalizations, or illnesses.  She also complains of a small bleeding lesion over her index finger.  There is no associated pain, but she says the it constantly oozes.   Medications:  Current Outpatient Prescriptions on File Prior to Visit  Medication Sig Dispense Refill  . losartan-hydrochlorothiazide (HYZAAR) 100-25 MG per tablet Take 1 tablet by mouth daily.  30 tablet  3   No current facility-administered medications on file prior to visit.    Allergies:  Allergies  Allergen Reactions  . Penicillins Itching     Review of Systems:  CONSTITUTIONAL: No fevers, chills, night sweats, or weight loss.   EYES: No visual changes or eye pain ENT: No hearing changes.  No history of nose bleeds.   RESPIRATORY: No cough, wheezing and shortness of breath.   CARDIOVASCULAR: Negative for chest pain, and palpitations.   GI: Negative for abdominal discomfort, blood in stools or black stools.  No recent change in bowel habits.   GU:  No history of incontinence.   MUSCLOSKELETAL: No history of joint pain or swelling.  No myalgias.   SKIN: Negative for lesions, rash, and itching.   ENDOCRINE: Negative for cold or heat intolerance, polydipsia or goiter.   PSYCH:  No depression or anxiety symptoms.  NEURO: As Above.   Vital Signs:  BP 142/84  Pulse 76  Temp(Src) 98.1 F (36.7 C) (Oral)  Ht 5\' 7"  (1.702 m)  Wt 301 lb (136.533 kg)  BMI 47.13 kg/m2  Extremities:  Left ring finger with bleeding sore, no signs of infection  Neurological Exam: MENTAL STATUS including orientation to time, place, person, recent and remote memory, attention  span and concentration, language, and fund of knowledge is normal.  Speech is not dysarthric.  CRANIAL NERVES: No visual field defects. Pupils equal round and reactive to light.  Normal conjugate, extra-ocular eye movements in all directions of gaze.  No ptosis. Normal facial sensation.  Face is symmetric. Palate elevates symmetrically.  Tongue is midline.  MOTOR:  Motor strength is 5/5 in all extremities.  Tone is increased in legs bilaterally (Ashworth scale 1+).  No atrophy, fasciculations or abnormal movements.  No pronator drift.      MSRs:   Right      Left  brachioradialis  2+   brachioradialis  2+   biceps  2+   biceps  2+   triceps  2+   triceps  2+   patellar  0   Patellar  0   ankle jerk  0   ankle jerk  0   Hoffman  no   Hoffman  no   plantar response  up   plantar response  up   No clonus  SENSORY:  Intact to light touch.  COORDINATION/GAIT:  Normal finger-to- nose-finger and heel-to-shin.  Intact rapid alternating movements bilaterally.  Gait mildly wide-based and stable.   Data: MRI lumbar spine 08/23/2013:    Normal alignment. Negative for fracture or mass. Postcontrast imaging reveals mild enhancement at L4-5 felt to be degenerative and not infection. Correlate with symptoms. Multilevel spondylosis. Conus medullaris is normal and terminates at L1.  T12-L1: Disc and facet degeneration. Spondylosis and mild spinal stenosis.  L1-2: Spondylosis and facet degeneration with mild spinal stenosis and foraminal stenosis.  L2-3: Spondylosis and facet hypertrophy. Mild spinal stenosis and foraminal stenosis bilaterally.  L3-4: Spondylosis and facet hypertrophy with moderate spinal stenosis. Foraminal stenosis bilaterally.  L4-5: Disc degeneration and spondylosis. Moderate facet hypertrophy bilaterally with moderate to severe spinal stenosis. Foraminal stenosis is present bilaterally.  L5-S1: Disc degeneration with spondylosis. Foraminal encroachment bilaterally secondary to  spurring.   MRI thoracic spine 08/23/2013:  Spinal cord signal is normal. No mass or cord compression Multilevel degenerative change as above. Thee is mild spinal stenosis at T11-T12 due to spondylosis and left-sided facet hypertrophy.     IMPRESSION/PLAN: 1. Neurogenic claudication with multilevel spondylosis, most severe spinal and foraminal stenosis at L4-L5  - Exam shows increased tone in her legs, extensor plantar response, with absent reflexes  - MRI of the thoracic spine and lumbar spine was personally reviewed with the patient.   - Discussed management options, will pursue conservative options and start physical therapy for back pain and leg spascity  - The opportunity to ask questions was offered, and I answered them to the best of my ability  - Start naprosyn 500mg  BID as needed for severe back pain 2. Muscle spasms, likely related to #1  - Stop robaxin  - Start baclofen 10mg  at bedtime and titrate further as needed.  Risks and benefits discussed.  - Encouraged patient to use heating pad/ice to back 3.  History of transverse myelitis  - Asymptomatic  - No evidence of abnormal cord signal or myelomalacia on imaging 4.  Bleeding  sore on left ring finger (?small cutaneous hemangioma)  - Recommended keeping it clean and dry  - If it does not improve, she should follow-up with PCP 5.  Return to clinic in 84-months, or sooner as needed    The duration of this appointment visit was 30 minutes of face-to-face time with the patient.  Greater than 50% of this time was spent in counseling, explanation of diagnosis, planning of further management, and coordination of care.   Thank you for allowing me to participate in patient's care.  If I can answer any additional questions, I would be pleased to do so.    Sincerely,    Maddyx Vallie K. Posey Pronto, DO

## 2013-09-27 ENCOUNTER — Ambulatory Visit: Payer: No Typology Code available for payment source | Admitting: Neurology

## 2013-10-18 ENCOUNTER — Encounter: Payer: No Typology Code available for payment source | Admitting: Internal Medicine

## 2013-10-21 ENCOUNTER — Ambulatory Visit: Payer: No Typology Code available for payment source | Attending: Neurology | Admitting: Physical Therapy

## 2013-10-21 DIAGNOSIS — R269 Unspecified abnormalities of gait and mobility: Secondary | ICD-10-CM | POA: Insufficient documentation

## 2013-10-21 DIAGNOSIS — M6281 Muscle weakness (generalized): Secondary | ICD-10-CM | POA: Insufficient documentation

## 2013-10-21 DIAGNOSIS — IMO0001 Reserved for inherently not codable concepts without codable children: Secondary | ICD-10-CM | POA: Insufficient documentation

## 2013-10-25 ENCOUNTER — Ambulatory Visit: Payer: No Typology Code available for payment source | Admitting: Rehabilitation

## 2013-11-18 ENCOUNTER — Ambulatory Visit (INDEPENDENT_AMBULATORY_CARE_PROVIDER_SITE_OTHER): Payer: No Typology Code available for payment source | Admitting: Internal Medicine

## 2013-11-18 ENCOUNTER — Encounter: Payer: Self-pay | Admitting: Internal Medicine

## 2013-11-18 VITALS — BP 195/89 | HR 75 | Temp 98.0°F | Resp 18 | Wt 306.0 lb

## 2013-11-18 DIAGNOSIS — F489 Nonpsychotic mental disorder, unspecified: Secondary | ICD-10-CM

## 2013-11-18 DIAGNOSIS — Z86711 Personal history of pulmonary embolism: Secondary | ICD-10-CM

## 2013-11-18 DIAGNOSIS — E785 Hyperlipidemia, unspecified: Secondary | ICD-10-CM

## 2013-11-18 DIAGNOSIS — Z862 Personal history of diseases of the blood and blood-forming organs and certain disorders involving the immune mechanism: Secondary | ICD-10-CM

## 2013-11-18 DIAGNOSIS — Z86718 Personal history of other venous thrombosis and embolism: Secondary | ICD-10-CM

## 2013-11-18 DIAGNOSIS — Z8639 Personal history of other endocrine, nutritional and metabolic disease: Secondary | ICD-10-CM

## 2013-11-18 DIAGNOSIS — Z87898 Personal history of other specified conditions: Secondary | ICD-10-CM

## 2013-11-18 DIAGNOSIS — F5105 Insomnia due to other mental disorder: Secondary | ICD-10-CM

## 2013-11-18 DIAGNOSIS — F4321 Adjustment disorder with depressed mood: Secondary | ICD-10-CM

## 2013-11-18 DIAGNOSIS — Z139 Encounter for screening, unspecified: Secondary | ICD-10-CM

## 2013-11-18 DIAGNOSIS — I10 Essential (primary) hypertension: Secondary | ICD-10-CM

## 2013-11-18 DIAGNOSIS — Z0000 Encounter for general adult medical examination without abnormal findings: Secondary | ICD-10-CM

## 2013-11-18 LAB — POCT URINALYSIS DIPSTICK
Bilirubin, UA: NEGATIVE
GLUCOSE UA: NEGATIVE
Ketones, UA: NEGATIVE
Leukocytes, UA: NEGATIVE
Nitrite, UA: NEGATIVE
Protein, UA: NEGATIVE
RBC UA: NEGATIVE
Spec Grav, UA: 1.02
UROBILINOGEN UA: NEGATIVE
pH, UA: 6.5

## 2013-11-18 LAB — HEMOCCULT GUIAC POC 1CARD (OFFICE): FECAL OCCULT BLD: NEGATIVE

## 2013-11-18 MED ORDER — ESZOPICLONE 2 MG PO TABS: ORAL_TABLET | ORAL | Status: DC

## 2013-11-18 MED ORDER — BUPROPION HCL ER (XL) 150 MG PO TB24: 150.0000 mg | ORAL_TABLET | ORAL | Status: DC

## 2013-11-18 NOTE — Patient Instructions (Addendum)
Will schedule a colonoscopy with [place]  In [facility]  Dr. In [facility]    Dr. Tora Kindred ,  Dr Brigitte Pulse,  [facility]  [phone]  Go to lab today    Stop by xray to schedule your mammgram  See me in 8 wweeks

## 2013-11-18 NOTE — Progress Notes (Signed)
Subjective:    Patient ID: Melanie Orr, female    DOB: 1950-10-26, 63 y.o.   MRN: [id number]  HPI  Melanie Orr is here for CPE.  She has seen Dr. Posey Pronto who is treating her for her neurogenic cladication, spinal stenosis and spondylosis.   Robaxin did not help much so pt is now trying  Baclofen and naprosyn for discomfort.    Pt wishes conservative treatment for now and will be starting PT soon.  She is managing with a walker at home.   See BP  .  She has not been taking her Hyzaar on a regular basis.    Pt teary today during interview.  She states that she has been "down in the dumps".  Sad most days of week,  Early morning awakening and trouble sleeping.  Difficult to get motivated, hard to concentrate.  She denies S/H ideation, plan or intent.   No psychotic features.  She would like to try something for depression and would like something to help her with sleep.   She has not history of any seizure disorder.    She had an elevated calcium in the distant past,  Repeat testing came back normal  History of DVT/PE  She is S/P  IVC Filter.    REpeat BP  168/90    Health maintenance :  She has not had a screening mm in quite some time and she has never had a colonoscopy but after my counsel she would like to have one scheduled.   She is S/P hysterectomy for benign reasons .  She has not had an eye exam in quite some time .    Allergies  Allergen Reactions  . Penicillins Itching   Past Medical History  Diagnosis Date  . Hypertension   . Arthritis   . Bulging disc   . Scoliosis   . Neuromuscular disorder   . Transverse myelitis 2004  . Spinal stenosis of lumbar region   . Pituitary adenoma    Past Surgical History  Procedure Laterality Date  . Transphenoidal pituitary resection  2004   History   Social History  . Marital Status: Single    Spouse Name: N/A    Number of Children: N/A  . Years of Education: N/A   Occupational History  . Not on file.   Social History  Main Topics  . Smoking status: Former Smoker    Quit date: 06/08/1979  . Smokeless tobacco: Never Used  . Alcohol Use: Yes     Comment: Occasionally drink wine  . Drug Use: No  . Sexual Activity: No   Other Topics Concern  . Not on file   Social History Narrative   Lives with daughter in a [place] home.   Single.  Two children.   Early retirement due to back problems in 2012.     Previously worked as a Counsellor for [employer].   Family History  Problem Relation Age of Onset  . Hypertension Mother   . Heart disease Mother     Deceased, 109  . Hypertension Sister   . Hypertension Brother   . Healthy Daughter   . Other Father     Deceased, 65s   Patient Active Problem List   Diagnosis Date Noted  . GERD (gastroesophageal reflux disease) 07/04/2013  . OSA (obstructive sleep apnea) 07/04/2013  . History of DVT (deep vein thrombosis) 07/04/2013  . History of pituitary tumor 07/04/2013  . Hyperlipidemia 06/27/2013  . HTN (hypertension)  06/07/2013  . Hypercalcemia 06/07/2013  . Transverse myelitis 06/07/2013  . S/P hysterectomy 06/07/2013  . History of pulmonary embolus (PE) 06/07/2013  . Spinal stenosis of lumbar region 06/07/2013  . Morbid obesity 06/07/2013   Current Outpatient Prescriptions on File Prior to Visit  Medication Sig Dispense Refill  . losartan-hydrochlorothiazide (HYZAAR) 100-25 MG per tablet Take 1 tablet by mouth daily.  30 tablet  3  . naproxen (NAPROSYN) 500 MG tablet Take 1 tablet (500 mg total) by mouth 2 (two) times daily with a meal.  20 tablet  3   No current facility-administered medications on file prior to visit.      Review of Systems  Constitutional: Negative for fever and unexpected weight change.  Respiratory: Negative for chest tightness and shortness of breath.   Cardiovascular: Negative for chest pain, palpitations and leg swelling.  Psychiatric/Behavioral: Positive for sleep disturbance and dysphoric mood.  Negative for suicidal ideas, hallucinations, behavioral problems and agitation.  All other systems reviewed and are negative.       Objective:   Physical Exam Physical Exam  Nursing note and vitals reviewed.   Repeat BP  168/90 Constitutional: She is oriented to person, place, and time. She appears well-developed and well-nourished.  HENT:  Head: Normocephalic and atraumatic.  Right Ear: Tympanic membrane and ear canal normal. No drainage. Tympanic membrane is not injected and not erythematous.  Left Ear: Tympanic membrane and ear canal normal. No drainage. Tympanic membrane is not injected and not erythematous.  Nose: Nose normal. Right sinus exhibits no maxillary sinus tenderness and no frontal sinus tenderness. Left sinus exhibits no maxillary sinus tenderness and no frontal sinus tenderness.  Mouth/Throat: Oropharynx is clear and moist. No oral lesions. No oropharyngeal exudate.  Eyes: Conjunctivae and EOM are normal. Pupils are equal, round, and reactive to light.  Neck: Normal range of motion. Neck supple. No JVD present. Carotid bruit is not present. No mass and no thyromegaly present.  Cardiovascular: Normal rate, regular rhythm, S1 normal, S2 normal and intact distal pulses. Exam reveals no gallop and no friction rub.  No murmur heard.  Pulses:  Carotid pulses are 2+ on the right side, and 2+ on the left side.  Dorsalis pedis pulses are 2+ on the right side, and 2+ on the left side.  No carotid bruit. No LE edema  Pulmonary/Chest: Breath sounds normal. She has no wheezes. She has no rales. She exhibits no tenderness. Breast  NO discrete masses no nipple discharge no axillary adenopathy bilaterally Abdominal: Soft. Bowel sounds are normal. She exhibits no distension and no mass. There is no hepatosplenomegaly. There is no tenderness. There is no CVA tenderness.   Rectal no mass guaiac neg.   Musculoskeletal: Normal range of motion.  No active synovitis to joints.    Lymphadenopathy:  She has no cervical adenopathy.  She has no axillary adenopathy.  Right: No inguinal and no supraclavicular adenopathy present.  Left: No inguinal and no supraclavicular adenopathy present.  Neurological: She is alert and oriented to person, place, and time. Reflexes absnet. She displays no tremor. No cranial nerve deficit or sensory deficit. Extensor reflex pos bilaterally  Skin: Skin is warm and dry. No rash noted. No cyanosis. Nails show no clubbing.  She has what appears to be a achrochodron on her  Psychiatric: She has a normal mood and affect. Her speech is normal and behavior is normal. Cognition and memory are normal.           Assessment &  Plan:  Health Maintenance  Will schedule mm  Refer to GI for colonoscopy.   I gave pt  Telephone numbers to several opthalmologists.   See scanned sheet  HTN not controlled but not taking daily med  Advised to be sure to take Hyzaar daily and I will recheck at next visit.  If still elevated will need to add agent.   Check all labs today  Hyperlipidemia  Will check today  Situational depression.   Discussed SE profile of anti-depressants,   She would like ot try Wellbutrin.  She is S/P pituitary resection for adenoma but no seizure history .  Will start low dose Wellbutrin at 150 mg daily  Insomnia related to above .  Willl give Lunesta to take 3 times per week  Neurogenic claudication, spinal stenosis  On bid Naprsyn and baclofen now.  To start PT  Skin lesion   I advised referral to derm but she does not wish now.    Elevated calcium  Will recheck with  intact PTH today  DVT/PE  S/P  IVC filter  GERD    OSA  See me in 8 weeks.  Or sooner prn  I spent 45 mins with this pt

## 2013-11-19 LAB — COMPREHENSIVE METABOLIC PANEL
ALBUMIN: 3.7 g/dL (ref 3.5–5.2)
ALT: 22 U/L (ref 0–35)
AST: 21 U/L (ref 0–37)
Alkaline Phosphatase: 60 U/L (ref 39–117)
BUN: 16 mg/dL (ref 6–23)
CALCIUM: 9.2 mg/dL (ref 8.4–10.5)
CO2: 33 meq/L — AB (ref 19–32)
Chloride: 100 mEq/L (ref 96–112)
Creat: 0.87 mg/dL (ref 0.50–1.10)
GLUCOSE: 92 mg/dL (ref 70–99)
Potassium: 3.9 mEq/L (ref 3.5–5.3)
Sodium: 143 mEq/L (ref 135–145)
Total Bilirubin: 0.8 mg/dL (ref 0.2–1.2)
Total Protein: 6.5 g/dL (ref 6.0–8.3)

## 2013-11-19 LAB — LIPID PANEL
Cholesterol: 227 mg/dL — ABNORMAL HIGH (ref 0–200)
HDL: 49 mg/dL (ref >39)
LDL CALC: 160 mg/dL — AB (ref 0–99)
Total CHOL/HDL Ratio: 4.6 Ratio
Triglycerides: 92 mg/dL (ref <150)
VLDL: 18 mg/dL (ref 0–40)

## 2013-11-19 LAB — VITAMIN D 25 HYDROXY (VIT D DEFICIENCY, FRACTURES): VIT D 25 HYDROXY: 24 ng/mL — AB (ref 30–89)

## 2013-11-21 ENCOUNTER — Encounter: Payer: Self-pay | Admitting: Internal Medicine

## 2013-11-21 DIAGNOSIS — F4321 Adjustment disorder with depressed mood: Secondary | ICD-10-CM | POA: Insufficient documentation

## 2013-11-21 DIAGNOSIS — F5105 Insomnia due to other mental disorder: Secondary | ICD-10-CM

## 2013-11-22 ENCOUNTER — Ambulatory Visit (HOSPITAL_BASED_OUTPATIENT_CLINIC_OR_DEPARTMENT_OTHER)
Admission: RE | Admit: 2013-11-22 | Discharge: 2013-11-22 | Disposition: A | Discharge status: Home / Self Care | Payer: No Typology Code available for payment source | Source: Ambulatory Visit | Attending: Internal Medicine | Admitting: Internal Medicine

## 2013-11-22 DIAGNOSIS — Z1231 Encounter for screening mammogram for malignant neoplasm of breast: Secondary | ICD-10-CM | POA: Insufficient documentation

## 2013-11-22 LAB — PTH, INTACT AND CALCIUM
CALCIUM: 9.2 mg/dL (ref 8.4–10.5)
PTH: 67 pg/mL (ref 14.0–72.0)

## 2013-11-26 ENCOUNTER — Telehealth: Payer: Self-pay | Admitting: *Deleted

## 2013-11-26 ENCOUNTER — Other Ambulatory Visit: Payer: Self-pay | Admitting: *Deleted

## 2013-11-26 DIAGNOSIS — Z1211 Encounter for screening for malignant neoplasm of colon: Secondary | ICD-10-CM

## 2013-11-29 ENCOUNTER — Telehealth: Payer: Self-pay | Admitting: Internal Medicine

## 2013-11-29 ENCOUNTER — Encounter: Payer: Self-pay | Admitting: Gastroenterology

## 2013-11-29 NOTE — Telephone Encounter (Signed)
Melanie Orr  Call pt and let her know that her bad cholesterol is quite high and I want to see her in office to discuss options.   Give her 30 min appt and message back with date

## 2013-11-29 NOTE — Telephone Encounter (Signed)
Pt appt 12/01/13 at 330

## 2013-11-30 NOTE — Telephone Encounter (Signed)
Pt called requesting the name of GI MD we referred her to

## 2013-12-01 ENCOUNTER — Ambulatory Visit (INDEPENDENT_AMBULATORY_CARE_PROVIDER_SITE_OTHER): Payer: No Typology Code available for payment source | Admitting: Internal Medicine

## 2013-12-01 ENCOUNTER — Encounter: Payer: Self-pay | Admitting: Internal Medicine

## 2013-12-01 VITALS — BP 147/79 | HR 90 | Temp 98.6°F | Resp 18

## 2013-12-01 DIAGNOSIS — E785 Hyperlipidemia, unspecified: Secondary | ICD-10-CM

## 2013-12-01 DIAGNOSIS — I10 Essential (primary) hypertension: Secondary | ICD-10-CM

## 2013-12-01 MED ORDER — LOSARTAN POTASSIUM-HCTZ 100-25 MG PO TABS: 1.0000 | ORAL_TABLET | Freq: Every day | ORAL | Status: DC

## 2013-12-01 MED ORDER — NAPROXEN 500 MG PO TABS: ORAL_TABLET | ORAL | Status: AC

## 2013-12-01 MED ORDER — ATORVASTATIN CALCIUM 10 MG PO TABS: ORAL_TABLET | ORAL | Status: DC

## 2013-12-01 NOTE — Progress Notes (Signed)
Subjective:    Patient ID: Melanie Orr, female    DOB: May 24, 1951, 63 y.o.   MRN: [id number]  HPI  [name] is here for follow up of hyperlipidemia .  See labs  Framingham risk score in 18.3%     Risk factro  Htn, obesity, FH  Discussed SE profile of statins including risk of myalgias and liver toxicity.  She would like ot try statin qod    Allergies  Allergen Reactions  . Penicillins Itching   Past Medical History  Diagnosis Date  . Hypertension   . Arthritis   . Bulging disc   . Scoliosis   . Neuromuscular disorder   . Transverse myelitis 2004  . Spinal stenosis of lumbar region   . Pituitary adenoma    Past Surgical History  Procedure Laterality Date  . Transphenoidal pituitary resection  2004  . Abdominal hysterectomy     History   Social History  . Marital Status: Single    Spouse Name: N/A    Number of Children: N/A  . Years of Education: N/A   Occupational History  . Not on file.   Social History Main Topics  . Smoking status: Former Smoker    Quit date: 06/08/1979  . Smokeless tobacco: Never Used  . Alcohol Use: Yes     Comment: Occasionally drink wine  . Drug Use: No  . Sexual Activity: No   Other Topics Concern  . Not on file   Social History Narrative   Lives with daughter in a [place] home.   Single.  Two children.   Early retirement due to back problems in 2012.     Previously worked as a Counsellor for [employer].   Family History  Problem Relation Age of Onset  . Hypertension Mother   . Heart disease Mother     Deceased, 59  . Hypertension Sister   . Hypertension Brother   . Healthy Daughter   . Other Father     Deceased, 7s   Patient Active Problem List   Diagnosis Date Noted  . Situational depression 11/21/2013  . Insomnia secondary to situational depression 11/21/2013  . GERD (gastroesophageal reflux disease) 07/04/2013  . OSA (obstructive sleep apnea) 07/04/2013  . History of DVT (deep vein  thrombosis) 07/04/2013  . History of pituitary tumor 07/04/2013  . Hyperlipidemia 06/27/2013  . HTN (hypertension) 06/07/2013  . Hypercalcemia 06/07/2013  . Transverse myelitis 06/07/2013  . S/P hysterectomy 06/07/2013  . History of pulmonary embolus (PE) 06/07/2013  . Spinal stenosis of lumbar region 06/07/2013  . Morbid obesity 06/07/2013   Current Outpatient Prescriptions on File Prior to Visit  Medication Sig Dispense Refill  . buPROPion (WELLBUTRIN XL) 150 MG 24 hr tablet Take 1 tablet (150 mg total) by mouth every morning.  30 tablet  2  . eszopiclone (LUNESTA) 2 MG TABS tablet Use at bedtime 3 times a week  12 tablet  1   No current facility-administered medications on file prior to visit.      Review of Systems See HPI    Objective:   Physical Exam  Physical Exam  Nursing note and vitals reviewed.  Constitutional: She is oriented to person, place, and time. She appears well-developed and well-nourished.  HENT:  Head: Normocephalic and atraumatic.  Cardiovascular: Normal rate and regular rhythm. Exam reveals no gallop and no friction rub.  No murmur heard.  Pulmonary/Chest: Breath sounds normal. She has no wheezes. She has no rales.  Neurological: She  is alert and oriented to person, place, and time.  Skin: Skin is warm and dry.  Psychiatric: She has a normal mood and affect. Her behavior is normal.            Assessment & Plan:  Hyperlipidemia  Will start low dose lipitor qod.  If any myalgias she is to stop med and notify me see me in 8 weeks for lfts  Htn refill losartan hctz  Obesity    See me in 8 weeks

## 2013-12-01 NOTE — Patient Instructions (Signed)
See me in 8 weeks  

## 2013-12-20 ENCOUNTER — Ambulatory Visit: Payer: No Typology Code available for payment source | Admitting: Neurology

## 2013-12-20 ENCOUNTER — Encounter: Payer: Self-pay | Admitting: Neurology

## 2013-12-20 ENCOUNTER — Telehealth: Payer: Self-pay | Admitting: Neurology

## 2013-12-20 NOTE — Telephone Encounter (Signed)
Pt no showed today's follow up appt w/ Dr. Posey Pronto. No show letter mailed to pt / Sherri S.

## 2014-01-10 ENCOUNTER — Ambulatory Visit: Payer: No Typology Code available for payment source | Admitting: Internal Medicine

## 2014-01-24 ENCOUNTER — Ambulatory Visit: Payer: No Typology Code available for payment source | Admitting: Internal Medicine

## 2014-01-27 ENCOUNTER — Encounter: Payer: No Typology Code available for payment source | Admitting: Gastroenterology

## 2014-07-11 ENCOUNTER — Encounter: Payer: Self-pay | Admitting: Internal Medicine

## 2014-07-29 ENCOUNTER — Other Ambulatory Visit: Payer: Self-pay | Admitting: Internal Medicine

## 2014-08-01 NOTE — Telephone Encounter (Signed)
Refill request

## 2014-08-03 ENCOUNTER — Encounter: Payer: Self-pay | Admitting: Internal Medicine

## 2014-08-03 ENCOUNTER — Ambulatory Visit (INDEPENDENT_AMBULATORY_CARE_PROVIDER_SITE_OTHER): Payer: Commercial Managed Care - HMO | Admitting: Internal Medicine

## 2014-08-03 VITALS — BP 178/74 | HR 84 | Temp 97.9°F | Resp 16 | Ht 66.0 in | Wt 304.0 lb

## 2014-08-03 DIAGNOSIS — R350 Frequency of micturition: Secondary | ICD-10-CM | POA: Diagnosis not present

## 2014-08-03 DIAGNOSIS — I10 Essential (primary) hypertension: Secondary | ICD-10-CM

## 2014-08-03 DIAGNOSIS — M48061 Spinal stenosis, lumbar region without neurogenic claudication: Secondary | ICD-10-CM

## 2014-08-03 DIAGNOSIS — M4806 Spinal stenosis, lumbar region: Secondary | ICD-10-CM | POA: Diagnosis not present

## 2014-08-03 DIAGNOSIS — Z23 Encounter for immunization: Secondary | ICD-10-CM

## 2014-08-03 DIAGNOSIS — F4321 Adjustment disorder with depressed mood: Secondary | ICD-10-CM

## 2014-08-03 DIAGNOSIS — F5105 Insomnia due to other mental disorder: Secondary | ICD-10-CM

## 2014-08-03 DIAGNOSIS — E785 Hyperlipidemia, unspecified: Secondary | ICD-10-CM | POA: Diagnosis not present

## 2014-08-03 LAB — COMPLETE METABOLIC PANEL WITH GFR
ALT: 14 U/L (ref 0–35)
AST: 18 U/L (ref 0–37)
Albumin: 3.6 g/dL (ref 3.5–5.2)
Alkaline Phosphatase: 65 U/L (ref 39–117)
BUN: 23 mg/dL (ref 6–23)
CHLORIDE: 107 meq/L (ref 96–112)
CO2: 31 meq/L (ref 19–32)
CREATININE: 0.95 mg/dL (ref 0.50–1.10)
Calcium: 9.1 mg/dL (ref 8.4–10.5)
GFR, EST AFRICAN AMERICAN: 73 mL/min
GFR, EST NON AFRICAN AMERICAN: 63 mL/min
GLUCOSE: 81 mg/dL (ref 70–99)
Potassium: 4 mEq/L (ref 3.5–5.3)
Sodium: 146 mEq/L — ABNORMAL HIGH (ref 135–145)
Total Bilirubin: 0.6 mg/dL (ref 0.2–1.2)
Total Protein: 6.3 g/dL (ref 6.0–8.3)

## 2014-08-03 LAB — POCT URINALYSIS DIPSTICK
Bilirubin, UA: NEGATIVE
GLUCOSE UA: NEGATIVE
KETONES UA: NEGATIVE
Leukocytes, UA: NEGATIVE
Nitrite, UA: NEGATIVE
PH UA: 6.5
Protein, UA: NEGATIVE
RBC UA: NEGATIVE
Spec Grav, UA: 1.015
Urobilinogen, UA: NEGATIVE

## 2014-08-03 LAB — LIPID PANEL
Cholesterol: 178 mg/dL (ref 0–200)
HDL: 50 mg/dL (ref >39)
LDL CALC: 108 mg/dL — AB (ref 0–99)
TRIGLYCERIDES: 102 mg/dL (ref <150)
Total CHOL/HDL Ratio: 3.6 Ratio
VLDL: 20 mg/dL (ref 0–40)

## 2014-08-03 LAB — TSH: TSH: 1.097 u[IU]/mL (ref 0.350–4.500)

## 2014-08-03 MED ORDER — ATORVASTATIN CALCIUM 10 MG PO TABS: ORAL_TABLET | ORAL | Status: AC

## 2014-08-03 MED ORDER — OXYBUTYNIN CHLORIDE ER 5 MG PO TB24: ORAL_TABLET | ORAL | Status: DC

## 2014-08-03 MED ORDER — LORAZEPAM 1 MG PO TABS: ORAL_TABLET | ORAL | Status: DC

## 2014-08-03 MED ORDER — BACLOFEN 10 MG PO TABS: ORAL_TABLET | ORAL | Status: DC

## 2014-08-03 NOTE — Progress Notes (Signed)
Subjective:    Patient ID: Melanie Orr, female    DOB: 1950-12-23, 63 y.o.   MRN: [id number]  HPI 11/2013 note Hyperlipidemia Will start low dose lipitor qod. If any myalgias she is to stop med and notify me see me in 8 weeks for lfts  Htn refill losartan hctz  Obesity   See me in 8 weeks     TODAY:  Melanie Orr is here for follow up.  She ran out of insurance so I have not seen her in a while.   She is out of all her medications for the last 3-4 days.  Trouble sleeping.  She did not like Wellbutrin.   Wakes at 2-3 am and cannot get back to sleep.  Mood brighter today   Has urinary frequency, urgency  No pain  Would like to try something for her bladder.   Has run out of baclofen RX by neurologist.  She knows she needs to see Dr Posey Pronto again .  See neurology note  See Bp  No headache no chest pain   Allergies  Allergen Reactions  . Penicillins Itching   Past Medical History  Diagnosis Date  . Hypertension   . Arthritis   . Bulging disc   . Scoliosis   . Neuromuscular disorder   . Transverse myelitis 2004  . Spinal stenosis of lumbar region   . Pituitary adenoma    Past Surgical History  Procedure Laterality Date  . Transphenoidal pituitary resection  2004  . Abdominal hysterectomy     History   Social History  . Marital Status: Single    Spouse Name: N/A    Number of Children: N/A  . Years of Education: N/A   Occupational History  . Not on file.   Social History Main Topics  . Smoking status: Former Smoker    Quit date: 06/08/1979  . Smokeless tobacco: Never Used  . Alcohol Use: Yes     Comment: Occasionally drink wine  . Drug Use: No  . Sexual Activity: No   Other Topics Concern  . Not on file   Social History Narrative   Lives with daughter in a [place] home.   Single.  Two children.   Early retirement due to back problems in 2012.     Previously worked as a Counsellor for [employer].   Family History  Problem  Relation Age of Onset  . Hypertension Mother   . Heart disease Mother     Deceased, 71  . Hypertension Sister   . Hypertension Brother   . Healthy Daughter   . Other Father     Deceased, 48s   Patient Active Problem List   Diagnosis Date Noted  . Situational depression 11/21/2013  . Insomnia secondary to situational depression 11/21/2013  . GERD (gastroesophageal reflux disease) 07/04/2013  . OSA (obstructive sleep apnea) 07/04/2013  . History of DVT (deep vein thrombosis) 07/04/2013  . History of pituitary tumor 07/04/2013  . Hyperlipidemia 06/27/2013  . HTN (hypertension) 06/07/2013  . Hypercalcemia 06/07/2013  . Transverse myelitis 06/07/2013  . S/P hysterectomy 06/07/2013  . History of pulmonary embolus (PE) 06/07/2013  . Spinal stenosis of lumbar region 06/07/2013  . Morbid obesity 06/07/2013   Current Outpatient Prescriptions on File Prior to Visit  Medication Sig Dispense Refill  . atorvastatin (LIPITOR) 10 MG tablet Take one every other day 48 tablet 1  . buPROPion (WELLBUTRIN XL) 150 MG 24 hr tablet Take 1 tablet (150 mg total) by  mouth every morning. 30 tablet 2  . eszopiclone (LUNESTA) 2 MG TABS tablet Use at bedtime 3 times a week 12 tablet 1  . losartan-hydrochlorothiazide (HYZAAR) 100-25 MG per tablet TAKE 1 TABLET BY MOUTH DAILY. 90 tablet 1  . naproxen (NAPROSYN) 500 MG tablet Take one tablet bid prn with food 20 tablet 3   No current facility-administered medications on file prior to visit.       Review of Systems See HPI    Objective:   Physical Exam Physical Exam  Nursing note and vitals reviewed.  Constitutional: She is oriented to person, place, and time. She appears well-developed and well-nourished.  HENT:  Head: Normocephalic and atraumatic.  Cardiovascular: Normal rate and regular rhythm. Exam reveals no gallop and no friction rub.  No murmur heard.  Pulmonary/Chest: Breath sounds normal. She has no wheezes. She has no rales.    Neurological: She is alert and oriented to person, place, and time.  Skin: Skin is warm and dry.  Psychiatric: She has a normal mood and affect. Her behavior is normal.       Assessment & Plan:  HTN:   Will re-order lisinopril/hctz   Check all labs today   Hyperlipidemia  Will take statin 3 times a weedk  Urinary urgency  U/A normal  Ok to try low dose ditropan 5 mg at hs  Spinal stenosis/transverse myelitis.  Advised pt she neesds to follow with Dr. Posey Pronto  Will re-order Baclofen 10 mg daily # 30 only   See me in January  Or sooner prn

## 2014-08-03 NOTE — Patient Instructions (Signed)
Make appointment to see Dr. Posey Pronto  See me in [place] 30 min appt   To lab and pharmacy today

## 2014-08-04 LAB — CBC WITH DIFFERENTIAL/PLATELET
BASOS PCT: 0 % (ref 0–1)
Basophils Absolute: 0 10*3/uL (ref 0.0–0.1)
EOS ABS: 0.2 10*3/uL (ref 0.0–0.7)
EOS PCT: 3 % (ref 0–5)
HEMATOCRIT: 35.6 % — AB (ref 36.0–46.0)
Hemoglobin: 12.1 g/dL (ref 12.0–15.0)
LYMPHS ABS: 2.6 10*3/uL (ref 0.7–4.0)
Lymphocytes Relative: 40 % (ref 12–46)
MCH: 29 pg (ref 26.0–34.0)
MCHC: 34 g/dL (ref 30.0–36.0)
MCV: 85.4 fL (ref 78.0–100.0)
MONO ABS: 0.6 10*3/uL (ref 0.1–1.0)
MONOS PCT: 9 % (ref 3–12)
MPV: 10.9 fL (ref 9.4–12.4)
NEUTROS PCT: 48 % (ref 43–77)
Neutro Abs: 3.1 10*3/uL (ref 1.7–7.7)
Platelets: 228 10*3/uL (ref 150–400)
RBC: 4.17 MIL/uL (ref 3.87–5.11)
RDW: 13.7 % (ref 11.5–15.5)
WBC: 6.5 10*3/uL (ref 4.0–10.5)

## 2014-08-04 LAB — VITAMIN D 25 HYDROXY (VIT D DEFICIENCY, FRACTURES): Vit D, 25-Hydroxy: 16 ng/mL — ABNORMAL LOW (ref 30–100)

## 2014-08-07 ENCOUNTER — Telehealth: Payer: Self-pay | Admitting: Internal Medicine

## 2014-08-07 MED ORDER — VITAMIN D (ERGOCALCIFEROL) 1.25 MG (50000 UNIT) PO CAPS: 50000.0000 [IU] | ORAL_CAPSULE | ORAL | Status: AC

## 2014-08-07 NOTE — Telephone Encounter (Signed)
Call pt and let her know that her labs overall look very good but her vitamin D is very low  I will e-scribe 50,000 units of vitamin D3 to be taken once a week for 12 weeks then take OTc 1000 units of vitamin D3 daily  Ok to mail labs to her  RX sent downstairs to be picked up

## 2014-08-08 ENCOUNTER — Telehealth: Payer: Self-pay | Admitting: *Deleted

## 2014-08-08 MED ORDER — ACETAMINOPHEN-CODEINE #3 300-30 MG PO TABS: ORAL_TABLET | ORAL | Status: AC

## 2014-08-08 NOTE — Telephone Encounter (Signed)
Melanie Orr is aware of her lab results-eh

## 2014-08-08 NOTE — Telephone Encounter (Signed)
RX phoned into [facility]-eh

## 2014-08-08 NOTE — Telephone Encounter (Signed)
Melanie Orr called and said that she would like to get to help with her arthritis pain. She said that you discussed it during her last visit but you were waiting for her lab results first. She also would like something to help her with constipation, she is taking Miralax and a stool softener but it is not helping her anymore. -eh

## 2014-09-21 ENCOUNTER — Encounter: Payer: Commercial Managed Care - HMO | Admitting: Internal Medicine

## 2014-09-21 NOTE — Progress Notes (Signed)
Subjective:    Patient ID: Melanie Orr, female    DOB: 07-29-1951, 64 y.o.   MRN: [id number]  HPI  11/25 note HTN: Will re-order lisinopril/hctz Check all labs today   Hyperlipidemia Will take statin 3 times a weedk  Urinary urgency U/A normal Ok to try low dose ditropan 5 mg at hs  Spinal stenosis/transverse myelitis. Advised pt she neesds to follow with Dr. Posey Pronto Will re-order Baclofen 10 mg daily # 30 only   See me in January Or sooner prn   TODAY:  Allergies  Allergen Reactions  . Penicillins Itching   Past Medical History  Diagnosis Date  . Hypertension   . Arthritis   . Bulging disc   . Scoliosis   . Neuromuscular disorder   . Transverse myelitis 2004  . Spinal stenosis of lumbar region   . Pituitary adenoma    Past Surgical History  Procedure Laterality Date  . Transphenoidal pituitary resection  2004  . Abdominal hysterectomy     History   Social History  . Marital Status: Single    Spouse Name: N/A    Number of Children: N/A  . Years of Education: N/A   Occupational History  . Not on file.   Social History Main Topics  . Smoking status: Former Smoker    Quit date: 06/08/1979  . Smokeless tobacco: Never Used  . Alcohol Use: Yes     Comment: Occasionally drink wine  . Drug Use: No  . Sexual Activity: No   Other Topics Concern  . Not on file   Social History Narrative   Lives with daughter in a [place] home.   Single.  Two children.   Early retirement due to back problems in 2012.     Previously worked as a Counsellor for [employer].   Family History  Problem Relation Age of Onset  . Hypertension Mother   . Heart disease Mother     Deceased, 61  . Hypertension Sister   . Hypertension Brother   . Healthy Daughter   . Other Father     Deceased, 65s   Patient Active Problem List   Diagnosis Date Noted  . Situational depression 11/21/2013  . Insomnia secondary to situational depression 11/21/2013   . GERD (gastroesophageal reflux disease) 07/04/2013  . OSA (obstructive sleep apnea) 07/04/2013  . History of DVT (deep vein thrombosis) 07/04/2013  . History of pituitary tumor 07/04/2013  . Hyperlipidemia 06/27/2013  . HTN (hypertension) 06/07/2013  . Hypercalcemia 06/07/2013  . Transverse myelitis 06/07/2013  . S/P hysterectomy 06/07/2013  . History of pulmonary embolus (PE) 06/07/2013  . Spinal stenosis of lumbar region 06/07/2013  . Morbid obesity 06/07/2013   Current Outpatient Prescriptions on File Prior to Visit  Medication Sig Dispense Refill  . acetaminophen-codeine (TYLENOL #3) 300-30 MG per tablet Take one tablet bid prn pain 30 tablet 0  . atorvastatin (LIPITOR) 10 MG tablet Take one every other day 48 tablet 1  . baclofen (LIORESAL) 10 MG tablet Take one tablet at bedtime 30 each 0  . buPROPion (WELLBUTRIN XL) 150 MG 24 hr tablet Take 1 tablet (150 mg total) by mouth every morning. (Patient not taking: Reported on 08/03/2014) 30 tablet 2  . LORazepam (ATIVAN) 1 MG tablet Take one tablet 3 nights a week for insomnia 15 tablet 1  . losartan-hydrochlorothiazide (HYZAAR) 100-25 MG per tablet TAKE 1 TABLET BY MOUTH DAILY. 90 tablet 1  . naproxen (NAPROSYN) 500 MG tablet Take one  tablet bid prn with food 20 tablet 3  . oxybutynin (DITROPAN XL) 5 MG 24 hr tablet Take one at bedtime to help with urination 30 tablet 1  . Vitamin D, Ergocalciferol, (DRISDOL) 50000 UNITS CAPS capsule Take 1 capsule (50,000 Units total) by mouth every 7 (seven) days. 12 capsule 0   No current facility-administered medications on file prior to visit.      Review of Systems See HPI    Objective:   Physical Exam  Physical Exam  Nursing note and vitals reviewed.  Constitutional: She is oriented to person, place, and time. She appears well-developed and well-nourished.  HENT:  Head: Normocephalic and atraumatic.  Cardiovascular: Normal rate and regular rhythm. Exam reveals no gallop and no  friction rub.  No murmur heard.  Pulmonary/Chest: Breath sounds normal. She has no wheezes. She has no rales.  Neurological: She is alert and oriented to person, place, and time.  Skin: Skin is warm and dry.  Psychiatric: She has a normal mood and affect. Her behavior is normal.             Assessment & Plan:

## 2015-01-26 DIAGNOSIS — H5210 Myopia, unspecified eye: Secondary | ICD-10-CM | POA: Diagnosis not present

## 2015-01-26 DIAGNOSIS — H524 Presbyopia: Secondary | ICD-10-CM | POA: Diagnosis not present

## 2015-05-17 ENCOUNTER — Ambulatory Visit: Payer: Commercial Managed Care - HMO | Admitting: Medical

## 2015-05-24 DIAGNOSIS — I10 Essential (primary) hypertension: Secondary | ICD-10-CM | POA: Diagnosis not present

## 2015-05-24 DIAGNOSIS — E780 Pure hypercholesterolemia: Secondary | ICD-10-CM | POA: Diagnosis not present

## 2015-10-16 MED FILL — LOSARTAN-HCTZ 100-25 MG TAB: 100-25 | 30 days supply | Qty: 30 | Fill #3

## 2015-12-07 MED FILL — LOSARTAN-HCTZ 100-25 MG TAB: 100-25 | 30 days supply | Qty: 30 | Fill #4

## 2016-01-29 MED FILL — LOSARTAN-HCTZ 100-25 MG TAB: 100-25 | 30 days supply | Qty: 30 | Fill #5

## 2016-03-18 ENCOUNTER — Other Ambulatory Visit: Payer: Self-pay | Admitting: Internal Medicine

## 2016-03-18 DIAGNOSIS — E559 Vitamin D deficiency, unspecified: Secondary | ICD-10-CM | POA: Diagnosis not present

## 2016-03-18 DIAGNOSIS — R109 Unspecified abdominal pain: Secondary | ICD-10-CM

## 2016-03-18 DIAGNOSIS — K469 Unspecified abdominal hernia without obstruction or gangrene: Secondary | ICD-10-CM

## 2016-03-18 DIAGNOSIS — Z86711 Personal history of pulmonary embolism: Secondary | ICD-10-CM | POA: Diagnosis not present

## 2016-03-18 DIAGNOSIS — M4806 Spinal stenosis, lumbar region: Secondary | ICD-10-CM | POA: Diagnosis not present

## 2016-03-18 DIAGNOSIS — K219 Gastro-esophageal reflux disease without esophagitis: Secondary | ICD-10-CM | POA: Diagnosis not present

## 2016-03-18 DIAGNOSIS — G4733 Obstructive sleep apnea (adult) (pediatric): Secondary | ICD-10-CM | POA: Diagnosis not present

## 2016-03-18 DIAGNOSIS — E7800 Pure hypercholesterolemia, unspecified: Secondary | ICD-10-CM | POA: Diagnosis not present

## 2016-03-18 DIAGNOSIS — I10 Essential (primary) hypertension: Secondary | ICD-10-CM | POA: Diagnosis not present

## 2016-03-18 DIAGNOSIS — K5900 Constipation, unspecified: Secondary | ICD-10-CM | POA: Diagnosis not present

## 2016-03-18 DIAGNOSIS — Z119 Encounter for screening for infectious and parasitic diseases, unspecified: Secondary | ICD-10-CM | POA: Diagnosis not present

## 2016-03-18 MED FILL — tiZANidine HCL 4 MG TABS: 4 | 15 days supply | Qty: 30 | Fill #0

## 2016-03-18 MED FILL — NAPROXEN 500 MG TABLET: 500 | 30 days supply | Qty: 60 | Fill #0

## 2016-03-25 ENCOUNTER — Ambulatory Visit
Admission: RE | Admit: 2016-03-25 | Discharge: 2016-03-25 | Disposition: A | Discharge status: Home / Self Care | Payer: Commercial Managed Care - HMO | Source: Ambulatory Visit | Attending: Internal Medicine | Admitting: Internal Medicine

## 2016-03-25 DIAGNOSIS — K469 Unspecified abdominal hernia without obstruction or gangrene: Secondary | ICD-10-CM

## 2016-03-25 DIAGNOSIS — R109 Unspecified abdominal pain: Secondary | ICD-10-CM

## 2016-03-25 DIAGNOSIS — R100 Acute abdomen: Secondary | ICD-10-CM | POA: Diagnosis not present

## 2016-03-25 MED ORDER — IOPAMIDOL (ISOVUE-300) INJECTION 61%
125.0000 mL | Freq: Once | INTRAVENOUS | Status: AC | PRN
  Administered 2016-03-25: 125 mL via INTRAVENOUS

## 2016-03-27 MED FILL — LOSARTAN-HCTZ 100-25 MG TAB: 100-25 | 30 days supply | Qty: 30 | Fill #6

## 2016-04-01 DIAGNOSIS — K5909 Other constipation: Secondary | ICD-10-CM | POA: Diagnosis not present

## 2016-04-01 DIAGNOSIS — K219 Gastro-esophageal reflux disease without esophagitis: Secondary | ICD-10-CM | POA: Diagnosis not present

## 2016-04-01 DIAGNOSIS — N949 Unspecified condition associated with female genital organs and menstrual cycle: Secondary | ICD-10-CM | POA: Diagnosis not present

## 2016-04-26 MED FILL — tiZANidine HCL 4 MG TABS: 4 | 15 days supply | Qty: 30 | Fill #1

## 2016-04-26 MED FILL — LOSARTAN-HCTZ 100-25 MG TAB: 100-25 | 90 days supply | Qty: 90 | Fill #7

## 2016-04-26 MED FILL — OMEPRAZOLE DR 20 MG CAPSULE: 20 | 60 days supply | Qty: 60 | Fill #0

## 2016-04-29 DIAGNOSIS — K5900 Constipation, unspecified: Secondary | ICD-10-CM | POA: Diagnosis not present

## 2016-04-29 DIAGNOSIS — R194 Change in bowel habit: Secondary | ICD-10-CM | POA: Diagnosis not present

## 2016-05-06 DIAGNOSIS — N949 Unspecified condition associated with female genital organs and menstrual cycle: Secondary | ICD-10-CM | POA: Diagnosis not present

## 2016-05-06 DIAGNOSIS — R234 Changes in skin texture: Secondary | ICD-10-CM | POA: Diagnosis not present

## 2016-05-06 DIAGNOSIS — R7301 Impaired fasting glucose: Secondary | ICD-10-CM | POA: Diagnosis not present

## 2016-05-08 MED FILL — NYSTATIN-TRIAMCINOLONE OINT: 100000-0.1 | 15 days supply | Qty: 30 | Fill #0

## 2016-06-03 DIAGNOSIS — N949 Unspecified condition associated with female genital organs and menstrual cycle: Secondary | ICD-10-CM | POA: Diagnosis not present

## 2016-06-26 MED FILL — NAPROXEN 500 MG TABLET: 500 | 30 days supply | Qty: 60 | Fill #1

## 2016-06-26 MED FILL — GAVILYTE-N SOLUTION: 420 | 1 days supply | Qty: 4000 | Fill #0

## 2016-07-01 DIAGNOSIS — K5730 Diverticulosis of large intestine without perforation or abscess without bleeding: Secondary | ICD-10-CM | POA: Diagnosis not present

## 2016-07-01 DIAGNOSIS — R194 Change in bowel habit: Secondary | ICD-10-CM | POA: Diagnosis not present

## 2016-07-30 DIAGNOSIS — Z1389 Encounter for screening for other disorder: Secondary | ICD-10-CM | POA: Diagnosis not present

## 2016-07-30 DIAGNOSIS — Z0000 Encounter for general adult medical examination without abnormal findings: Secondary | ICD-10-CM | POA: Diagnosis not present

## 2016-07-30 DIAGNOSIS — Z23 Encounter for immunization: Secondary | ICD-10-CM | POA: Diagnosis not present

## 2016-08-05 DIAGNOSIS — R3915 Urgency of urination: Secondary | ICD-10-CM | POA: Diagnosis not present

## 2016-08-05 DIAGNOSIS — F411 Generalized anxiety disorder: Secondary | ICD-10-CM | POA: Diagnosis not present

## 2016-08-05 DIAGNOSIS — M48061 Spinal stenosis, lumbar region without neurogenic claudication: Secondary | ICD-10-CM | POA: Diagnosis not present

## 2016-08-05 DIAGNOSIS — F40243 Fear of flying: Secondary | ICD-10-CM | POA: Diagnosis not present

## 2016-08-06 MED FILL — OXYBUTYNIN CL ER 5 MG TAB: 5 | 30 days supply | Qty: 30 | Fill #0

## 2016-08-06 MED FILL — ALPRAZolam 0.5 MG TABS: 0.5 | 30 days supply | Qty: 12 | Fill #0

## 2016-09-16 DIAGNOSIS — N949 Unspecified condition associated with female genital organs and menstrual cycle: Secondary | ICD-10-CM | POA: Diagnosis not present

## 2016-09-19 MED FILL — ALPRAZolam 0.5 MG TABS: 0.5 | 30 days supply | Qty: 12 | Fill #1

## 2016-09-20 MED FILL — LOSARTAN POTASSIUM 100 MG T: 100 | 90 days supply | Qty: 90 | Fill #0

## 2016-09-23 ENCOUNTER — Other Ambulatory Visit (HOSPITAL_BASED_OUTPATIENT_CLINIC_OR_DEPARTMENT_OTHER): Payer: Self-pay | Admitting: Internal Medicine

## 2016-09-23 DIAGNOSIS — I10 Essential (primary) hypertension: Secondary | ICD-10-CM | POA: Diagnosis not present

## 2016-09-23 DIAGNOSIS — N949 Unspecified condition associated with female genital organs and menstrual cycle: Secondary | ICD-10-CM | POA: Diagnosis not present

## 2016-09-23 DIAGNOSIS — J0100 Acute maxillary sinusitis, unspecified: Secondary | ICD-10-CM | POA: Diagnosis not present

## 2016-09-23 DIAGNOSIS — M48062 Spinal stenosis, lumbar region with neurogenic claudication: Secondary | ICD-10-CM | POA: Diagnosis not present

## 2016-09-23 DIAGNOSIS — F419 Anxiety disorder, unspecified: Secondary | ICD-10-CM | POA: Diagnosis not present

## 2016-09-23 DIAGNOSIS — Z1231 Encounter for screening mammogram for malignant neoplasm of breast: Secondary | ICD-10-CM

## 2016-09-23 MED FILL — AZITHROMYCIN 250 MG TABLET: 250 | 5 days supply | Qty: 6 | Fill #0

## 2016-09-23 MED FILL — NAPROXEN 500 MG TABLET: 500 | 15 days supply | Qty: 30 | Fill #0

## 2016-09-25 MED FILL — LOSARTAN-HCTZ 100-25 MG TAB: 100-25 | 90 days supply | Qty: 90 | Fill #0

## 2016-09-27 MED FILL — tiZANidine HCL 4 MG TABS: 4 | 15 days supply | Qty: 30 | Fill #0

## 2016-09-30 ENCOUNTER — Ambulatory Visit (HOSPITAL_BASED_OUTPATIENT_CLINIC_OR_DEPARTMENT_OTHER)
Admission: RE | Admit: 2016-09-30 | Discharge: 2016-09-30 | Disposition: A | Discharge status: Home / Self Care | Payer: Commercial Managed Care - HMO | Source: Ambulatory Visit | Attending: Internal Medicine | Admitting: Internal Medicine

## 2016-09-30 DIAGNOSIS — Z1231 Encounter for screening mammogram for malignant neoplasm of breast: Secondary | ICD-10-CM | POA: Diagnosis not present

## 2016-10-01 MED FILL — HYDROCHLOROTHIAZIDE 25 MG T: 25 | 90 days supply | Qty: 90 | Fill #0

## 2016-12-02 ENCOUNTER — Other Ambulatory Visit: Payer: Self-pay | Admitting: Orthopedic Surgery

## 2016-12-02 DIAGNOSIS — M1712 Unilateral primary osteoarthritis, left knee: Secondary | ICD-10-CM | POA: Diagnosis not present

## 2016-12-02 DIAGNOSIS — M549 Dorsalgia, unspecified: Secondary | ICD-10-CM

## 2016-12-02 DIAGNOSIS — M48061 Spinal stenosis, lumbar region without neurogenic claudication: Secondary | ICD-10-CM | POA: Diagnosis not present

## 2016-12-02 MED FILL — diazePAM 10 MG TABS: 10 | 1 days supply | Qty: 1 | Fill #0

## 2016-12-02 MED FILL — HYDROCODON-APAP 5-325: 5-325 | 5 days supply | Qty: 40 | Fill #0

## 2016-12-16 ENCOUNTER — Ambulatory Visit
Admission: RE | Admit: 2016-12-16 | Discharge: 2016-12-16 | Disposition: A | Discharge status: Home / Self Care | Payer: Commercial Managed Care - HMO | Source: Ambulatory Visit | Attending: Orthopedic Surgery | Admitting: Orthopedic Surgery

## 2016-12-16 DIAGNOSIS — M549 Dorsalgia, unspecified: Secondary | ICD-10-CM

## 2016-12-16 DIAGNOSIS — M48061 Spinal stenosis, lumbar region without neurogenic claudication: Secondary | ICD-10-CM | POA: Diagnosis not present

## 2017-01-13 ENCOUNTER — Other Ambulatory Visit: Payer: Self-pay | Admitting: Neurological Surgery

## 2017-01-13 DIAGNOSIS — M4727 Other spondylosis with radiculopathy, lumbosacral region: Secondary | ICD-10-CM | POA: Diagnosis not present

## 2017-01-13 DIAGNOSIS — R5381 Other malaise: Secondary | ICD-10-CM

## 2017-01-13 MED FILL — LOSARTAN POTASSIUM 100 MG T: 100 | 90 days supply | Qty: 90 | Fill #0

## 2017-01-13 MED FILL — DICLOFENAC SOD 75 MG TAB EC: 75 | 30 days supply | Qty: 60 | Fill #0

## 2017-01-13 MED FILL — ALPRAZolam 0.5 MG TABS: 0.5 | 6 days supply | Qty: 12 | Fill #0

## 2017-01-13 MED FILL — HYDROCHLOROTHIAZIDE 25 MG T: 25 | 90 days supply | Qty: 90 | Fill #1

## 2017-01-13 MED FILL — METHOCARBAMOL 750 MG TABLET: 750 | 30 days supply | Qty: 120 | Fill #0

## 2017-01-17 ENCOUNTER — Other Ambulatory Visit: Payer: Self-pay | Admitting: Neurological Surgery

## 2017-01-22 DIAGNOSIS — M4727 Other spondylosis with radiculopathy, lumbosacral region: Secondary | ICD-10-CM | POA: Diagnosis not present

## 2017-01-22 DIAGNOSIS — Z9181 History of falling: Secondary | ICD-10-CM | POA: Diagnosis not present

## 2017-01-22 DIAGNOSIS — R2681 Unsteadiness on feet: Secondary | ICD-10-CM | POA: Diagnosis not present

## 2017-01-22 DIAGNOSIS — Z7409 Other reduced mobility: Secondary | ICD-10-CM | POA: Diagnosis not present

## 2017-01-24 DIAGNOSIS — M4727 Other spondylosis with radiculopathy, lumbosacral region: Secondary | ICD-10-CM | POA: Diagnosis not present

## 2017-01-24 DIAGNOSIS — R2681 Unsteadiness on feet: Secondary | ICD-10-CM | POA: Diagnosis not present

## 2017-01-24 DIAGNOSIS — Z9181 History of falling: Secondary | ICD-10-CM | POA: Diagnosis not present

## 2017-01-24 DIAGNOSIS — Z7409 Other reduced mobility: Secondary | ICD-10-CM | POA: Diagnosis not present

## 2017-01-27 DIAGNOSIS — Z7409 Other reduced mobility: Secondary | ICD-10-CM | POA: Diagnosis not present

## 2017-01-27 DIAGNOSIS — M4727 Other spondylosis with radiculopathy, lumbosacral region: Secondary | ICD-10-CM | POA: Diagnosis not present

## 2017-01-27 DIAGNOSIS — Z9181 History of falling: Secondary | ICD-10-CM | POA: Diagnosis not present

## 2017-01-27 DIAGNOSIS — R2681 Unsteadiness on feet: Secondary | ICD-10-CM | POA: Diagnosis not present

## 2017-01-30 DIAGNOSIS — R2681 Unsteadiness on feet: Secondary | ICD-10-CM | POA: Diagnosis not present

## 2017-01-30 DIAGNOSIS — Z9181 History of falling: Secondary | ICD-10-CM | POA: Diagnosis not present

## 2017-01-30 DIAGNOSIS — Z7409 Other reduced mobility: Secondary | ICD-10-CM | POA: Diagnosis not present

## 2017-01-30 DIAGNOSIS — M4727 Other spondylosis with radiculopathy, lumbosacral region: Secondary | ICD-10-CM | POA: Diagnosis not present

## 2017-02-04 DIAGNOSIS — R2681 Unsteadiness on feet: Secondary | ICD-10-CM | POA: Diagnosis not present

## 2017-02-04 DIAGNOSIS — M4727 Other spondylosis with radiculopathy, lumbosacral region: Secondary | ICD-10-CM | POA: Diagnosis not present

## 2017-02-04 DIAGNOSIS — Z7409 Other reduced mobility: Secondary | ICD-10-CM | POA: Diagnosis not present

## 2017-02-04 DIAGNOSIS — Z9181 History of falling: Secondary | ICD-10-CM | POA: Diagnosis not present

## 2017-02-05 DIAGNOSIS — M5416 Radiculopathy, lumbar region: Secondary | ICD-10-CM | POA: Diagnosis not present

## 2017-02-10 DIAGNOSIS — Z9181 History of falling: Secondary | ICD-10-CM | POA: Diagnosis not present

## 2017-02-10 DIAGNOSIS — Z7409 Other reduced mobility: Secondary | ICD-10-CM | POA: Diagnosis not present

## 2017-02-10 DIAGNOSIS — R2681 Unsteadiness on feet: Secondary | ICD-10-CM | POA: Diagnosis not present

## 2017-02-10 DIAGNOSIS — M4727 Other spondylosis with radiculopathy, lumbosacral region: Secondary | ICD-10-CM | POA: Diagnosis not present

## 2017-02-12 DIAGNOSIS — R600 Localized edema: Secondary | ICD-10-CM | POA: Diagnosis not present

## 2017-02-12 DIAGNOSIS — I10 Essential (primary) hypertension: Secondary | ICD-10-CM | POA: Diagnosis not present

## 2017-02-12 DIAGNOSIS — M48061 Spinal stenosis, lumbar region without neurogenic claudication: Secondary | ICD-10-CM | POA: Diagnosis not present

## 2017-02-12 MED FILL — POTASSIUM CL ER 20 MEQ TABL: 20 | 30 days supply | Qty: 30 | Fill #0

## 2017-02-13 DIAGNOSIS — M4727 Other spondylosis with radiculopathy, lumbosacral region: Secondary | ICD-10-CM | POA: Diagnosis not present

## 2017-02-13 DIAGNOSIS — M4722 Other spondylosis with radiculopathy, cervical region: Secondary | ICD-10-CM | POA: Diagnosis not present

## 2017-02-13 MED FILL — DICLOFENAC SOD 75 MG TAB EC: 75 | 30 days supply | Qty: 60 | Fill #1

## 2017-02-13 MED FILL — GABAPENTIN 300 MG CAPSULE: 300 | 30 days supply | Qty: 90 | Fill #0

## 2017-02-17 DIAGNOSIS — M4727 Other spondylosis with radiculopathy, lumbosacral region: Secondary | ICD-10-CM | POA: Diagnosis not present

## 2017-02-17 DIAGNOSIS — Z9181 History of falling: Secondary | ICD-10-CM | POA: Diagnosis not present

## 2017-02-17 DIAGNOSIS — Z7409 Other reduced mobility: Secondary | ICD-10-CM | POA: Diagnosis not present

## 2017-02-17 DIAGNOSIS — R2681 Unsteadiness on feet: Secondary | ICD-10-CM | POA: Diagnosis not present

## 2017-02-19 DIAGNOSIS — R2681 Unsteadiness on feet: Secondary | ICD-10-CM | POA: Diagnosis not present

## 2017-02-19 DIAGNOSIS — Z7409 Other reduced mobility: Secondary | ICD-10-CM | POA: Diagnosis not present

## 2017-02-19 DIAGNOSIS — M4727 Other spondylosis with radiculopathy, lumbosacral region: Secondary | ICD-10-CM | POA: Diagnosis not present

## 2017-02-19 DIAGNOSIS — Z9181 History of falling: Secondary | ICD-10-CM | POA: Diagnosis not present

## 2017-02-25 DIAGNOSIS — M4727 Other spondylosis with radiculopathy, lumbosacral region: Secondary | ICD-10-CM | POA: Diagnosis not present

## 2017-02-25 DIAGNOSIS — R293 Abnormal posture: Secondary | ICD-10-CM | POA: Diagnosis not present

## 2017-02-25 DIAGNOSIS — M2560 Stiffness of unspecified joint, not elsewhere classified: Secondary | ICD-10-CM | POA: Diagnosis not present

## 2017-03-04 DIAGNOSIS — R293 Abnormal posture: Secondary | ICD-10-CM | POA: Diagnosis not present

## 2017-03-04 DIAGNOSIS — M4727 Other spondylosis with radiculopathy, lumbosacral region: Secondary | ICD-10-CM | POA: Diagnosis not present

## 2017-03-04 DIAGNOSIS — M2560 Stiffness of unspecified joint, not elsewhere classified: Secondary | ICD-10-CM | POA: Diagnosis not present

## 2017-03-05 ENCOUNTER — Other Ambulatory Visit: Payer: Self-pay | Admitting: Neurological Surgery

## 2017-03-05 DIAGNOSIS — M4722 Other spondylosis with radiculopathy, cervical region: Secondary | ICD-10-CM

## 2017-03-14 DIAGNOSIS — M4727 Other spondylosis with radiculopathy, lumbosacral region: Secondary | ICD-10-CM | POA: Diagnosis not present

## 2017-03-14 DIAGNOSIS — R293 Abnormal posture: Secondary | ICD-10-CM | POA: Diagnosis not present

## 2017-03-14 DIAGNOSIS — M2560 Stiffness of unspecified joint, not elsewhere classified: Secondary | ICD-10-CM | POA: Diagnosis not present

## 2017-03-17 MED FILL — METHOCARBAMOL 750 MG TABLET: 750 | 30 days supply | Qty: 120 | Fill #1

## 2017-03-17 MED FILL — DICLOFENAC SODIUM 75 MG TAB: 75 | 30 days supply | Qty: 60 | Fill #2

## 2017-03-18 DIAGNOSIS — M4727 Other spondylosis with radiculopathy, lumbosacral region: Secondary | ICD-10-CM | POA: Diagnosis not present

## 2017-03-18 DIAGNOSIS — R293 Abnormal posture: Secondary | ICD-10-CM | POA: Diagnosis not present

## 2017-03-18 DIAGNOSIS — M2560 Stiffness of unspecified joint, not elsewhere classified: Secondary | ICD-10-CM | POA: Diagnosis not present

## 2017-03-20 DIAGNOSIS — M4727 Other spondylosis with radiculopathy, lumbosacral region: Secondary | ICD-10-CM | POA: Diagnosis not present

## 2017-03-20 DIAGNOSIS — M2560 Stiffness of unspecified joint, not elsewhere classified: Secondary | ICD-10-CM | POA: Diagnosis not present

## 2017-03-20 DIAGNOSIS — R293 Abnormal posture: Secondary | ICD-10-CM | POA: Diagnosis not present

## 2017-04-02 ENCOUNTER — Other Ambulatory Visit: Payer: Commercial Managed Care - HMO

## 2017-04-04 ENCOUNTER — Other Ambulatory Visit: Payer: Commercial Managed Care - HMO

## 2017-04-11 MED FILL — POTASSIUM CL ER 20 MEQ TABL: 20 | 30 days supply | Qty: 30 | Fill #1

## 2017-04-11 MED FILL — GABAPENTIN 300 MG CAPSULE: 300 | 30 days supply | Qty: 90 | Fill #1

## 2017-04-11 MED FILL — LOSARTAN POTASSIUM 100 MG T: 100 | 90 days supply | Qty: 90 | Fill #1

## 2017-04-24 ENCOUNTER — Ambulatory Visit
Admission: RE | Admit: 2017-04-24 | Discharge: 2017-04-24 | Disposition: A | Discharge status: Home / Self Care | Payer: Commercial Managed Care - HMO | Source: Ambulatory Visit | Attending: Neurological Surgery | Admitting: Neurological Surgery

## 2017-04-24 DIAGNOSIS — M4722 Other spondylosis with radiculopathy, cervical region: Secondary | ICD-10-CM

## 2017-04-24 DIAGNOSIS — M50321 Other cervical disc degeneration at C4-C5 level: Secondary | ICD-10-CM | POA: Diagnosis not present

## 2017-04-24 DIAGNOSIS — M50322 Other cervical disc degeneration at C5-C6 level: Secondary | ICD-10-CM | POA: Diagnosis not present

## 2017-04-30 DIAGNOSIS — Z6841 Body Mass Index (BMI) 40.0 and over, adult: Secondary | ICD-10-CM | POA: Diagnosis not present

## 2017-04-30 DIAGNOSIS — M4722 Other spondylosis with radiculopathy, cervical region: Secondary | ICD-10-CM | POA: Diagnosis not present

## 2017-04-30 DIAGNOSIS — M4727 Other spondylosis with radiculopathy, lumbosacral region: Secondary | ICD-10-CM | POA: Diagnosis not present

## 2017-04-30 DIAGNOSIS — I10 Essential (primary) hypertension: Secondary | ICD-10-CM | POA: Diagnosis not present

## 2017-04-30 MED FILL — GABAPENTIN 300 MG CAPSULE: 300 | 15 days supply | Qty: 90 | Fill #0

## 2017-04-30 MED FILL — ALPRAZolam 0.5 MG TABS: 0.5 | 6 days supply | Qty: 12 | Fill #0

## 2017-05-01 MED FILL — DICLOFENAC SODIUM 75 MG TAB: 75 | 30 days supply | Qty: 60 | Fill #0

## 2017-05-28 DIAGNOSIS — Z6841 Body Mass Index (BMI) 40.0 and over, adult: Secondary | ICD-10-CM | POA: Diagnosis not present

## 2017-05-28 DIAGNOSIS — I10 Essential (primary) hypertension: Secondary | ICD-10-CM | POA: Diagnosis not present

## 2017-05-28 DIAGNOSIS — M9981 Other biomechanical lesions of cervical region: Secondary | ICD-10-CM | POA: Diagnosis not present

## 2017-05-28 DIAGNOSIS — M5412 Radiculopathy, cervical region: Secondary | ICD-10-CM | POA: Diagnosis not present

## 2017-06-13 MED FILL — DICLOFENAC SODIUM 75 MG TAB: 75 | 30 days supply | Qty: 60 | Fill #1

## 2017-06-13 MED FILL — METHOCARBAMOL 750 MG TABLET: 750 | 30 days supply | Qty: 120 | Fill #2

## 2017-06-13 MED FILL — POTASSIUM CL ER 20 MEQ TABL: 20 | 30 days supply | Qty: 30 | Fill #2

## 2017-06-16 ENCOUNTER — Encounter: Payer: Self-pay | Admitting: Endocrinology

## 2017-06-16 DIAGNOSIS — I10 Essential (primary) hypertension: Secondary | ICD-10-CM | POA: Diagnosis not present

## 2017-06-16 DIAGNOSIS — R946 Abnormal results of thyroid function studies: Secondary | ICD-10-CM | POA: Diagnosis not present

## 2017-06-16 DIAGNOSIS — N83201 Unspecified ovarian cyst, right side: Secondary | ICD-10-CM | POA: Diagnosis not present

## 2017-06-16 DIAGNOSIS — R634 Abnormal weight loss: Secondary | ICD-10-CM | POA: Diagnosis not present

## 2017-06-16 DIAGNOSIS — Z23 Encounter for immunization: Secondary | ICD-10-CM | POA: Diagnosis not present

## 2017-06-16 DIAGNOSIS — M25511 Pain in right shoulder: Secondary | ICD-10-CM | POA: Diagnosis not present

## 2017-06-16 DIAGNOSIS — E559 Vitamin D deficiency, unspecified: Secondary | ICD-10-CM | POA: Diagnosis not present

## 2017-06-16 DIAGNOSIS — M48061 Spinal stenosis, lumbar region without neurogenic claudication: Secondary | ICD-10-CM | POA: Diagnosis not present

## 2017-06-16 DIAGNOSIS — E7800 Pure hypercholesterolemia, unspecified: Secondary | ICD-10-CM | POA: Diagnosis not present

## 2017-06-17 MED FILL — AMLODIPINE BESYLATE 5 MG TA: 5 | 90 days supply | Qty: 90 | Fill #0

## 2017-07-21 ENCOUNTER — Other Ambulatory Visit: Payer: Self-pay | Admitting: Internal Medicine

## 2017-07-21 ENCOUNTER — Other Ambulatory Visit (HOSPITAL_COMMUNITY): Payer: Self-pay | Admitting: Internal Medicine

## 2017-07-21 DIAGNOSIS — E7800 Pure hypercholesterolemia, unspecified: Secondary | ICD-10-CM | POA: Diagnosis not present

## 2017-07-21 DIAGNOSIS — I10 Essential (primary) hypertension: Secondary | ICD-10-CM | POA: Diagnosis not present

## 2017-07-21 DIAGNOSIS — R634 Abnormal weight loss: Secondary | ICD-10-CM | POA: Diagnosis not present

## 2017-07-21 DIAGNOSIS — R7989 Other specified abnormal findings of blood chemistry: Secondary | ICD-10-CM

## 2017-07-21 DIAGNOSIS — R946 Abnormal results of thyroid function studies: Secondary | ICD-10-CM | POA: Diagnosis not present

## 2017-07-21 DIAGNOSIS — N83201 Unspecified ovarian cyst, right side: Secondary | ICD-10-CM | POA: Diagnosis not present

## 2017-07-21 DIAGNOSIS — Z87898 Personal history of other specified conditions: Secondary | ICD-10-CM | POA: Diagnosis not present

## 2017-07-21 DIAGNOSIS — F5104 Psychophysiologic insomnia: Secondary | ICD-10-CM | POA: Diagnosis not present

## 2017-07-22 MED FILL — hydrOXYzine HCL 50 MG TABS: 50 | 30 days supply | Qty: 30 | Fill #0

## 2017-07-28 ENCOUNTER — Ambulatory Visit
Admission: RE | Admit: 2017-07-28 | Discharge: 2017-07-28 | Disposition: A | Discharge status: Home / Self Care | Payer: Commercial Managed Care - HMO | Source: Ambulatory Visit | Attending: Internal Medicine | Admitting: Internal Medicine

## 2017-07-28 DIAGNOSIS — R7989 Other specified abnormal findings of blood chemistry: Secondary | ICD-10-CM

## 2017-07-28 DIAGNOSIS — E042 Nontoxic multinodular goiter: Secondary | ICD-10-CM | POA: Diagnosis not present

## 2017-08-05 ENCOUNTER — Other Ambulatory Visit: Payer: Self-pay | Admitting: Internal Medicine

## 2017-08-05 DIAGNOSIS — E041 Nontoxic single thyroid nodule: Secondary | ICD-10-CM

## 2017-08-11 DIAGNOSIS — N83201 Unspecified ovarian cyst, right side: Secondary | ICD-10-CM | POA: Diagnosis not present

## 2017-08-11 DIAGNOSIS — N949 Unspecified condition associated with female genital organs and menstrual cycle: Secondary | ICD-10-CM | POA: Diagnosis not present

## 2017-08-12 ENCOUNTER — Other Ambulatory Visit (HOSPITAL_COMMUNITY)
Admission: RE | Admit: 2017-08-12 | Discharge: 2017-08-12 | Disposition: A | Discharge status: Home / Self Care | Payer: Medicare HMO | Source: Ambulatory Visit | Attending: General Surgery | Admitting: General Surgery

## 2017-08-12 ENCOUNTER — Ambulatory Visit
Admission: RE | Admit: 2017-08-12 | Discharge: 2017-08-12 | Disposition: A | Discharge status: Home / Self Care | Payer: Medicare HMO | Source: Ambulatory Visit | Attending: Internal Medicine | Admitting: Internal Medicine

## 2017-08-12 DIAGNOSIS — E041 Nontoxic single thyroid nodule: Secondary | ICD-10-CM

## 2017-08-12 DIAGNOSIS — R896 Abnormal cytological findings in specimens from other organs, systems and tissues: Secondary | ICD-10-CM | POA: Diagnosis not present

## 2017-08-12 IMAGING — US US THYROID BIOPSY
1 series · 13 of 16 positions shown · non-contrast
Comparison: Ultrasound on [DATE]

MEDICATIONS:
1% lidocaine

COMPLICATIONS:
None immediate.

INDICATION: Indeterminate thyroid nodule

EXAM:
ULTRASOUND GUIDED FINE NEEDLE ASPIRATION OF INDETERMINATE THYROID
NODULE
TECHNIQUE: Informed written consent was obtained from the patient after a
discussion of the risks, benefits and alternatives to treatment.
Questions regarding the procedure were encouraged and answered. A
timeout was performed prior to the initiation of the procedure.

[Series 1: us thyroid biopsy · 0.06mm/px · 16 acquisitions, 13 frames shown]
[im 1/16]
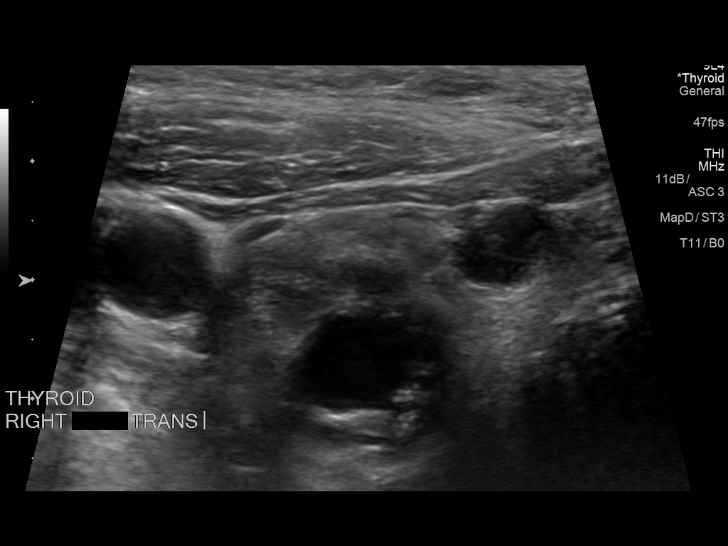
[im 2/16]
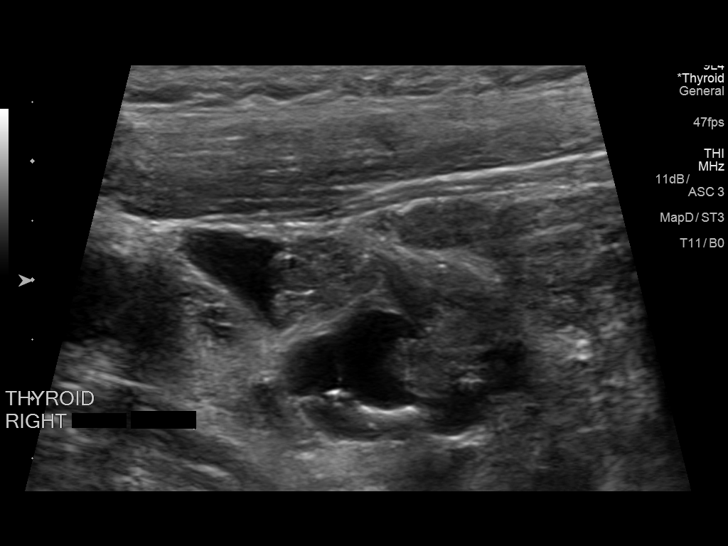
[im 4/16]
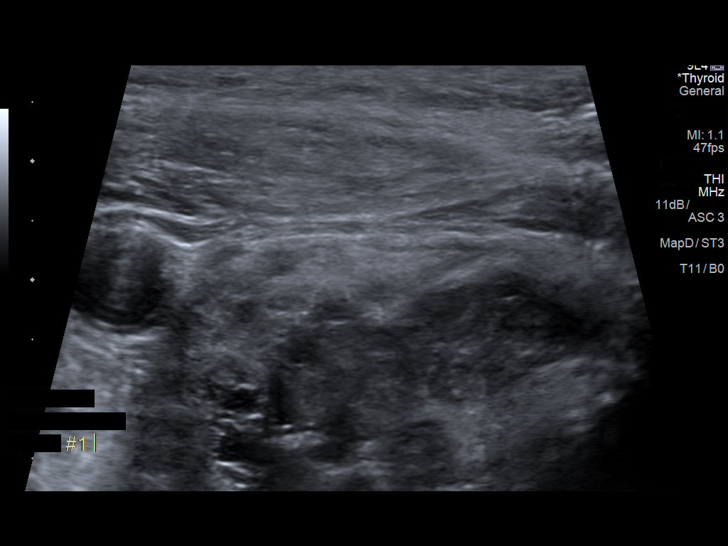
[im 5/16]
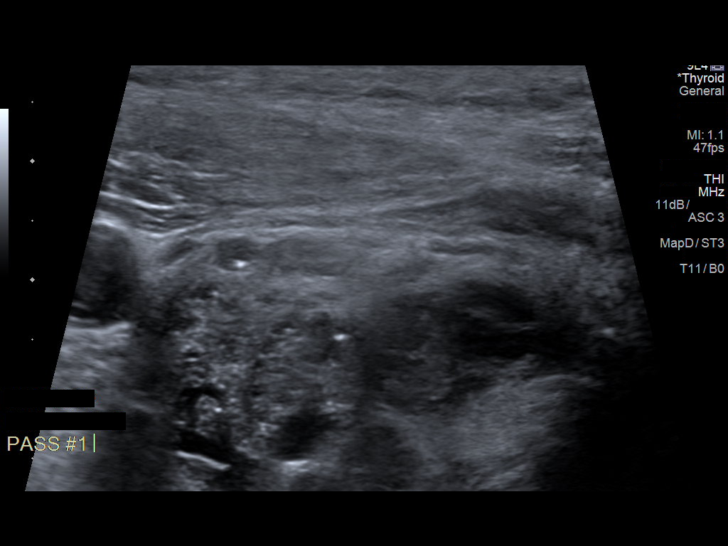
[im 6/16]
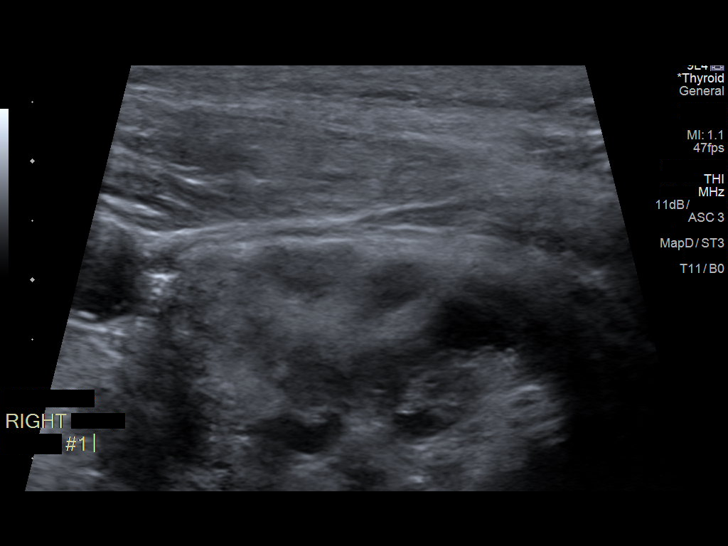
[im 7/16]
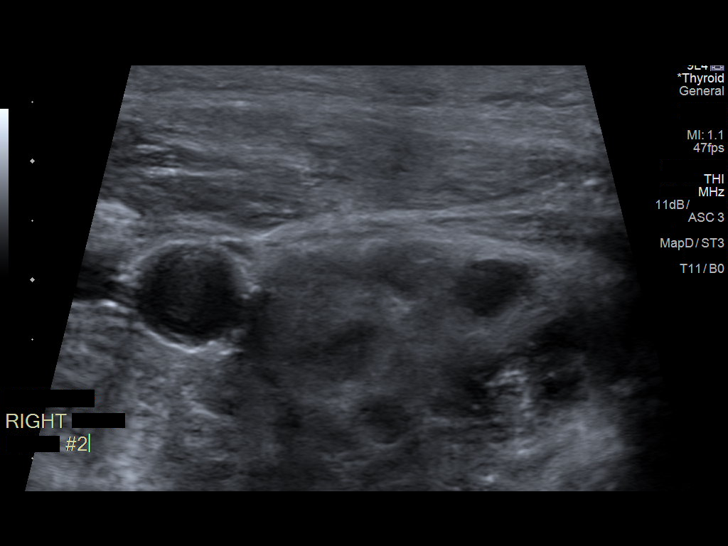
[im 9/16]
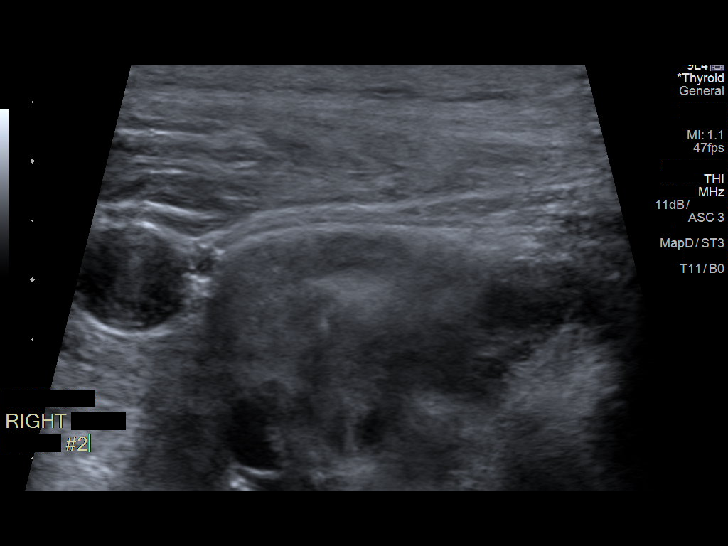
[im 10/16]
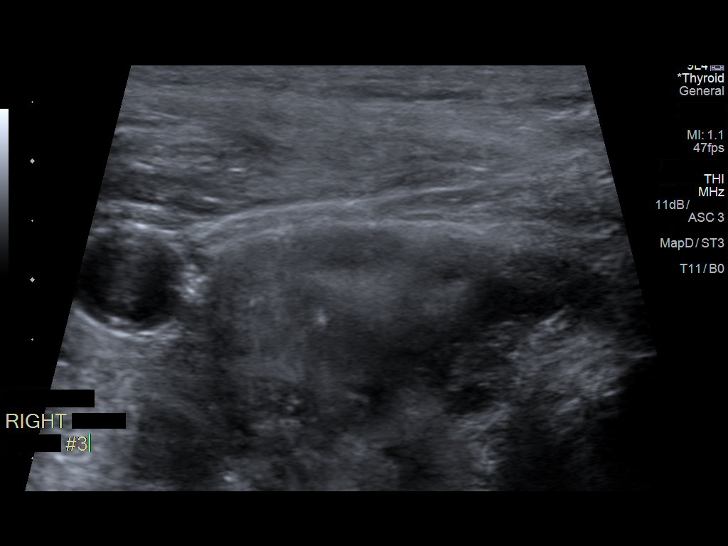
[im 11/16]
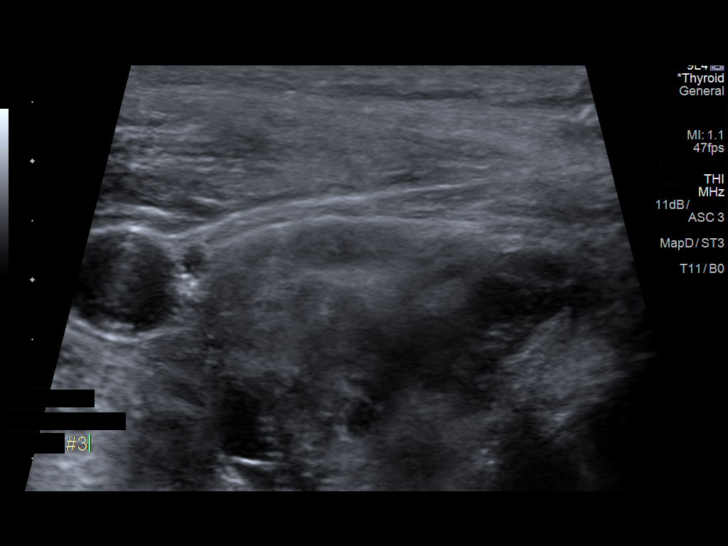
[im 12/16]
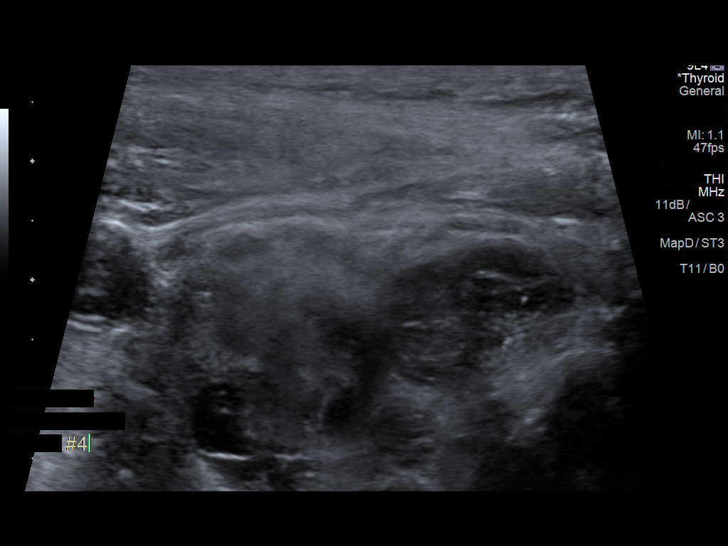
[im 13/16]
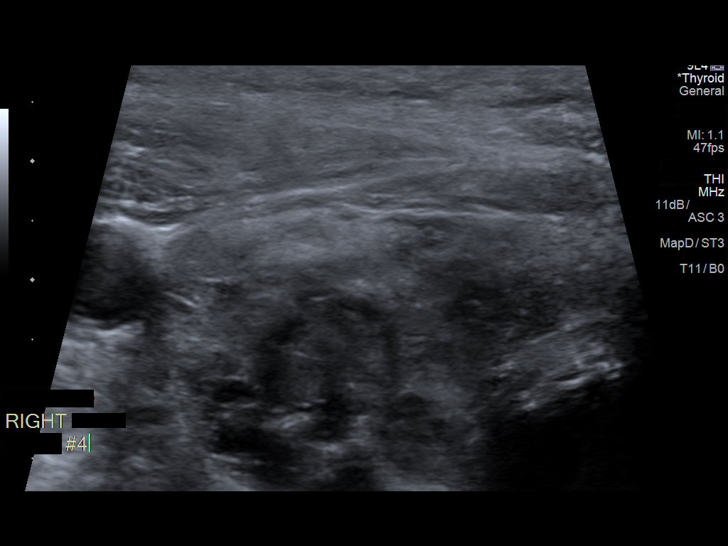
[im 15/16]
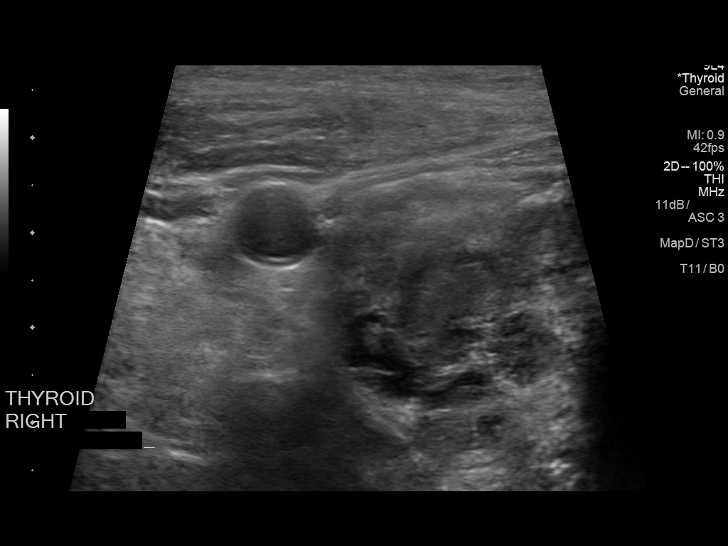
[im 16/16]
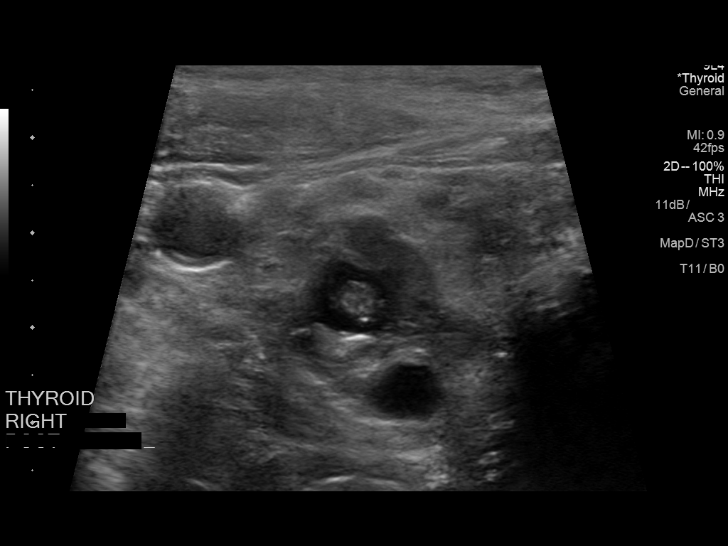

[13 of 16 positions shown; findings below may reference images not displayed]

Pre-procedural ultrasound scanning demonstrated unchanged size and
appearance of the indeterminate nodule within the right inferior
thyroid nodule.

The procedure was planned. The neck was prepped in the usual sterile
fashion, and a sterile drape was applied covering the operative
field. A timeout was performed prior to the initiation of the
procedure. Local anesthesia was provided with 1% lidocaine.

Under direct ultrasound guidance, 4 FNA biopsies were performed of
the right inferior nodule with a 25 gauge needle. Multiple
ultrasound images were saved for procedural documentation purposes.
The samples were prepared and submitted to pathology.

Limited post procedural scanning was negative for hematoma or
additional complication. Dressings were placed. The patient
tolerated the above procedures procedure well without immediate
postprocedural complication.
FINDINGS: Nodule reference number based on prior diagnostic ultrasound: 4

Maximum size: 2.0 cm

Location: Right; Inferior

ACR TI-RADS risk category: TR4 (4-6 points)

Reason for biopsy: meets ACR TI-RADS criteria

Ultrasound imaging confirms appropriate placement of the needles
within the thyroid nodule.
IMPRESSION: Technically successful ultrasound guided fine needle aspiration of
nodule 4 within the right inferior aspect of the thyroid.

## 2017-08-18 ENCOUNTER — Encounter (HOSPITAL_COMMUNITY): Payer: Self-pay

## 2017-08-18 ENCOUNTER — Ambulatory Visit (HOSPITAL_COMMUNITY): Payer: Medicare HMO

## 2017-08-18 ENCOUNTER — Ambulatory Visit (HOSPITAL_COMMUNITY): Payer: Commercial Managed Care - HMO

## 2017-08-19 ENCOUNTER — Ambulatory Visit (HOSPITAL_COMMUNITY): Payer: Medicare HMO

## 2017-08-25 DIAGNOSIS — Z87898 Personal history of other specified conditions: Secondary | ICD-10-CM | POA: Diagnosis not present

## 2017-08-25 DIAGNOSIS — N949 Unspecified condition associated with female genital organs and menstrual cycle: Secondary | ICD-10-CM | POA: Diagnosis not present

## 2017-08-25 DIAGNOSIS — E7800 Pure hypercholesterolemia, unspecified: Secondary | ICD-10-CM | POA: Diagnosis not present

## 2017-08-25 DIAGNOSIS — E041 Nontoxic single thyroid nodule: Secondary | ICD-10-CM | POA: Diagnosis not present

## 2017-08-25 DIAGNOSIS — R634 Abnormal weight loss: Secondary | ICD-10-CM | POA: Diagnosis not present

## 2017-08-25 DIAGNOSIS — F5104 Psychophysiologic insomnia: Secondary | ICD-10-CM | POA: Diagnosis not present

## 2017-08-25 DIAGNOSIS — I10 Essential (primary) hypertension: Secondary | ICD-10-CM | POA: Diagnosis not present

## 2017-09-01 MED FILL — hydrOXYzine HCL 50 MG TABS: 50 | 30 days supply | Qty: 30 | Fill #0

## 2017-09-15 MED FILL — NAPROXEN 500 MG TABLET: 500 | 30 days supply | Qty: 60 | Fill #0

## 2017-09-15 MED FILL — ROSUVASTATIN CALCIUM 10 MG: 10 | 90 days supply | Qty: 36 | Fill #0

## 2017-10-13 MED FILL — hydrOXYzine HCL 50 MG TABS: 50 | 30 days supply | Qty: 30 | Fill #1

## 2017-10-13 MED FILL — AMLODIPINE BESYLATE 5 MG TA: 5 | 90 days supply | Qty: 90 | Fill #1

## 2017-10-13 MED FILL — POTASSIUM CL ER 20 MEQ TABL: 20 | 30 days supply | Qty: 30 | Fill #3

## 2017-10-13 MED FILL — DICLOFENAC SODIUM 75 MG TAB: 75 | 30 days supply | Qty: 60 | Fill #2

## 2017-10-13 MED FILL — GABAPENTIN 300 MG CAPSULE: 300 | 15 days supply | Qty: 90 | Fill #1

## 2017-10-20 ENCOUNTER — Ambulatory Visit: Payer: Medicare HMO | Admitting: Endocrinology

## 2017-11-03 DIAGNOSIS — I10 Essential (primary) hypertension: Secondary | ICD-10-CM | POA: Diagnosis not present

## 2017-11-03 DIAGNOSIS — N949 Unspecified condition associated with female genital organs and menstrual cycle: Secondary | ICD-10-CM | POA: Diagnosis not present

## 2017-11-03 DIAGNOSIS — M48061 Spinal stenosis, lumbar region without neurogenic claudication: Secondary | ICD-10-CM | POA: Diagnosis not present

## 2017-11-03 DIAGNOSIS — E7800 Pure hypercholesterolemia, unspecified: Secondary | ICD-10-CM | POA: Diagnosis not present

## 2017-11-03 DIAGNOSIS — F5104 Psychophysiologic insomnia: Secondary | ICD-10-CM | POA: Diagnosis not present

## 2017-11-07 MED FILL — LOSARTAN-HCTZ 100-25 MG TAB: 100-25 | 90 days supply | Qty: 90 | Fill #0

## 2017-11-07 MED FILL — NAPROXEN 500 MG TABLET: 500 | 30 days supply | Qty: 60 | Fill #1

## 2017-11-07 MED FILL — GABAPENTIN 300 MG CAPSULE: 300 | 15 days supply | Qty: 90 | Fill #2

## 2017-11-17 ENCOUNTER — Ambulatory Visit (INDEPENDENT_AMBULATORY_CARE_PROVIDER_SITE_OTHER): Payer: Medicare HMO | Admitting: Endocrinology

## 2017-11-17 ENCOUNTER — Encounter: Payer: Self-pay | Admitting: Endocrinology

## 2017-11-17 DIAGNOSIS — E0590 Thyrotoxicosis, unspecified without thyrotoxic crisis or storm: Secondary | ICD-10-CM

## 2017-11-17 DIAGNOSIS — E042 Nontoxic multinodular goiter: Secondary | ICD-10-CM

## 2017-11-17 MED ORDER — METHIMAZOLE 5 MG PO TABS: 5.0000 mg | ORAL_TABLET | Freq: Every day | ORAL | 11 refills | Status: AC

## 2017-11-17 MED ORDER — METHIMAZOLE 5 MG PO TABS: 5.0000 mg | ORAL_TABLET | Freq: Every day | ORAL | 11 refills | Status: DC

## 2017-11-17 MED FILL — methIMAzole 5 MG TABS: 5 | 30 days supply | Qty: 30 | Fill #0

## 2017-11-17 NOTE — Patient Instructions (Addendum)
Your blood pressure is high today.  Please see your primary care provider soon, to have it rechecked.   I have sent a prescription to your pharmacy, to slow the thyroid. If ever you have fever while taking methimazole, stop it and call us, even if the reason is obvious, because of the risk of a rare side-effect.   Please come back for a follow-up appointment in 1 month.

## 2017-11-17 NOTE — Progress Notes (Signed)
Subjective:    Patient ID: Melanie Orr, female    DOB: [birth year], 67 y.o.   MRN: [id number]  HPI Pt is referred by [facility], for hyperthyroidism.  Pt reports he was dx'ed with hyperthyroidism in early 2019.  she has never been on therapy for this.  she has never had XRT to the anterior neck, or thyroid surgery.  she does not consume kelp or any other prescribed or non-prescribed thyroid medication.  she has never been on amiodarone.  She has slight weight loss, and assoc mild anxiety Past Medical History:  Diagnosis Date  . Arthritis   . Bulging disc   . Hypertension   . Neuromuscular disorder ([place])   . Pituitary adenoma ([place])   . Scoliosis   . Spinal stenosis of lumbar region   . Transverse myelitis ([place]) 2004    Past Surgical History:  Procedure Laterality Date  . ABDOMINAL HYSTERECTOMY    . TRANSPHENOIDAL PITUITARY RESECTION  2004    Social History   Socioeconomic History  . Marital status: Single    Spouse name: Not on file  . Number of children: Not on file  . Years of education: Not on file  . Highest education level: Not on file  Social Needs  . Financial resource strain: Not on file  . Food insecurity - worry: Not on file  . Food insecurity - inability: Not on file  . Transportation needs - medical: Not on file  . Transportation needs - non-medical: Not on file  Occupational History  . Not on file  Tobacco Use  . Smoking status: Former Smoker    Last attempt to quit: 06/08/1979    Years since quitting: 38.4  . Smokeless tobacco: Never Used  Substance and Sexual Activity  . Alcohol use: Yes    Comment: Occasionally drink wine  . Drug use: No  . Sexual activity: No  Other Topics Concern  . Not on file  Social History Narrative   Lives with daughter in a [place] home.   Single.  Two children.   Early retirement due to back problems in 2012.     Previously worked as a Counsellor for [employer].    Current Outpatient  Medications on File Prior to Visit  Medication Sig Dispense Refill  . ALPRAZolam (XANAX) 0.5 MG tablet Take 0.5 mg by mouth every 12 (twelve) hours as needed.    Marland Kitchen amLODipine (NORVASC) 5 MG tablet Take 5 mg by mouth daily.    Marland Kitchen losartan-hydrochlorothiazide (HYZAAR) 100-25 MG per tablet TAKE 1 TABLET BY MOUTH DAILY. 90 tablet 1  . naproxen (NAPROSYN) 500 MG tablet Take one tablet bid prn with food 20 tablet 3  . acetaminophen-codeine (TYLENOL #3) 300-30 MG per tablet Take one tablet bid prn pain (Patient not taking: Reported on 11/17/2017) 30 tablet 0  . atorvastatin (LIPITOR) 10 MG tablet Take one every other day (Patient not taking: Reported on 11/17/2017) 48 tablet 1  . baclofen (LIORESAL) 10 MG tablet Take one tablet at bedtime (Patient not taking: Reported on 11/17/2017) 30 each 0  . buPROPion (WELLBUTRIN XL) 150 MG 24 hr tablet Take 1 tablet (150 mg total) by mouth every morning. (Patient not taking: Reported on 08/03/2014) 30 tablet 2  . gabapentin (NEURONTIN) 300 MG capsule Take 300 mg by mouth daily.  2  . hydrochlorothiazide (HYDRODIURIL) 25 MG tablet Take 25 mg by mouth daily.    Marland Kitchen LORazepam (ATIVAN) 1 MG tablet Take one tablet 3 nights a  week for insomnia (Patient not taking: Reported on 11/17/2017) 15 tablet 1  . losartan (COZAAR) 100 MG tablet Take 100 mg by mouth daily.    Marland Kitchen oxybutynin (DITROPAN XL) 5 MG 24 hr tablet Take one at bedtime to help with urination (Patient not taking: Reported on 11/17/2017) 30 tablet 1  . potassium chloride SA (K-DUR,KLOR-CON) 20 MEQ tablet Take 20 mEq by mouth daily.  3  . rosuvastatin (CRESTOR) 10 MG tablet Take 10 mg by mouth daily.  1  . tiZANidine (ZANAFLEX) 4 MG tablet Take 4 mg by mouth every 6 (six) hours as needed.    . Vitamin D, Ergocalciferol, (DRISDOL) 50000 UNITS CAPS capsule Take 1 capsule (50,000 Units total) by mouth every 7 (seven) days. (Patient not taking: Reported on 11/17/2017) 12 capsule 0   No current facility-administered medications  on file prior to visit.     Allergies  Allergen Reactions  . Penicillins Itching    Family History  Problem Relation Age of Onset  . Hypertension Mother   . Heart disease Mother        Deceased, 30  . Other Father        Deceased, 54s  . Hypertension Sister   . Hypertension Brother   . Healthy Daughter   . Thyroid disease Neg Hx     BP (!) 190/84 (BP Location: Right Arm, Patient Position: Sitting, Cuff Size: Normal)   Pulse 69   Wt 286 lb (129.7 kg)   SpO2 96%   BMI 46.16 kg/m    Review of Systems denies fever, headache, hoarseness, visual loss, palpitations, sob, diarrhea, polyuria, muscle weakness, edema, excessive diaphoresis, tremor, heat intolerance, easy bruising, and rhinorrhea.      Objective:   Physical Exam VS: see vs page GEN: no distress HEAD: head: no deformity eyes: no periorbital swelling, no proptosis external nose and ears are normal mouth: no lesion seen NECK: thyroid is 3-5 times normal size, with multinodular surface CHEST WALL: no deformity LUNGS: clear to auscultation CV: reg rate and rhythm, no murmur ABD: abdomen is soft, nontender.  no hepatosplenomegaly.  not distended.  no hernia MUSCULOSKELETAL: muscle bulk and strength are grossly normal.  no obvious joint swelling.  gait is steady with a walker.   EXTEMITIES: no deformity.  1+ bilat leg edema PULSES: no carotid bruit NEURO:  cn 2-12 grossly intact.   readily moves all 4's.  sensation is intact to touch on all 4's.   SKIN:  Normal texture and temperature.  No rash or suspicious lesion is visible.   NODES:  None palpable at the neck PSYCH: alert, well-oriented.  Does not appear anxious nor depressed.   [place]: Multiple bilateral nodules are noted.   Bx: BENIGN FOLLICULAR NODULE (BETHESDA CATEGORY II)  Lab Results  Component Value Date   TSH 1.097 08/03/2014   I have reviewed outside records, and summarized: Pt was noted to have elevated a1c, and referred here.  She was reported to  have h/o pituitary tumor, with transphenoidal resection at [facility].  However, thi apparently predated care everywhere application.  outside test results are reviewed: TSH=0.23 free T4=1.1     Assessment & Plan:  Multinodular goiter, usually hereditary.  bx is low risk for malignancy.   Hyperthyroidism, new.  Due to the goiter.  Weight loss and anxiety: in this setting, she should have a trial of rx, to see if it helps sxs.  We discussed.  Pt agrees.   Patient Instructions  Your blood pressure is  high today.  Please see your primary care provider soon, to have it rechecked.   I have sent a prescription to your pharmacy, to slow the thyroid. If ever you have fever while taking methimazole, stop it and call us, even if the reason is obvious, because of the risk of a rare side-effect.   Please come back for a follow-up appointment in 1 month.

## 2017-11-18 DIAGNOSIS — E042 Nontoxic multinodular goiter: Secondary | ICD-10-CM | POA: Insufficient documentation

## 2017-11-18 DIAGNOSIS — E0590 Thyrotoxicosis, unspecified without thyrotoxic crisis or storm: Secondary | ICD-10-CM | POA: Insufficient documentation

## 2017-12-15 DIAGNOSIS — H52223 Regular astigmatism, bilateral: Secondary | ICD-10-CM | POA: Diagnosis not present

## 2017-12-15 DIAGNOSIS — H04123 Dry eye syndrome of bilateral lacrimal glands: Secondary | ICD-10-CM | POA: Diagnosis not present

## 2017-12-15 DIAGNOSIS — H524 Presbyopia: Secondary | ICD-10-CM | POA: Diagnosis not present

## 2017-12-15 DIAGNOSIS — H25813 Combined forms of age-related cataract, bilateral: Secondary | ICD-10-CM | POA: Diagnosis not present

## 2017-12-15 DIAGNOSIS — I10 Essential (primary) hypertension: Secondary | ICD-10-CM | POA: Diagnosis not present

## 2017-12-15 MED FILL — DOXYCYCLINE MONOHYDRATE 50: 50 | 90 days supply | Qty: 180 | Fill #0

## 2017-12-18 ENCOUNTER — Ambulatory Visit: Payer: Medicare HMO | Admitting: Endocrinology

## 2018-01-12 ENCOUNTER — Ambulatory Visit: Payer: Medicare HMO | Admitting: Endocrinology

## 2018-01-12 MED FILL — ROSUVASTATIN CALCIUM 10 MG: 10 | 90 days supply | Qty: 36 | Fill #1

## 2018-01-12 MED FILL — methIMAzole 5 MG TABS: 5 | 30 days supply | Qty: 30 | Fill #1

## 2018-01-19 DIAGNOSIS — H25813 Combined forms of age-related cataract, bilateral: Secondary | ICD-10-CM | POA: Diagnosis not present

## 2018-01-19 DIAGNOSIS — H04123 Dry eye syndrome of bilateral lacrimal glands: Secondary | ICD-10-CM | POA: Diagnosis not present

## 2018-02-09 ENCOUNTER — Encounter: Payer: Self-pay | Admitting: Endocrinology

## 2018-02-09 ENCOUNTER — Ambulatory Visit (INDEPENDENT_AMBULATORY_CARE_PROVIDER_SITE_OTHER): Payer: Medicare HMO | Admitting: Endocrinology

## 2018-02-09 VITALS — BP 126/68 | HR 68 | Ht 66.0 in | Wt 288.0 lb

## 2018-02-09 DIAGNOSIS — E0590 Thyrotoxicosis, unspecified without thyrotoxic crisis or storm: Secondary | ICD-10-CM

## 2018-02-09 DIAGNOSIS — E042 Nontoxic multinodular goiter: Secondary | ICD-10-CM

## 2018-02-09 LAB — TSH: TSH: 1.26 u[IU]/mL (ref 0.35–4.50)

## 2018-02-09 LAB — T4, FREE: Free T4: 0.89 ng/dL (ref 0.60–1.60)

## 2018-02-09 NOTE — Progress Notes (Signed)
Subjective:    Patient ID: Melanie Orr, female    DOB: [birth year], 67 y.o.   MRN: [id number]  HPI Pt returns for f/u of hyperthyroidism (dx'ed early 2019; US showed multiple bilateral nodules; Bx showed BENIGN FOLLICULAR NODULE (BETHESDA CATEGORY II); TSH suppression was mild, but she had a trial of rx, due to sxs).  pt states she feels well in general.  She takes tapazole as rx'ed.  Past Medical History:  Diagnosis Date  . Arthritis   . Bulging disc   . Hypertension   . Neuromuscular disorder ([place])   . Pituitary adenoma ([place])   . Scoliosis   . Spinal stenosis of lumbar region   . Transverse myelitis ([place]) 2004    Past Surgical History:  Procedure Laterality Date  . ABDOMINAL HYSTERECTOMY    . TRANSPHENOIDAL PITUITARY RESECTION  2004    Social History   Socioeconomic History  . Marital status: Single    Spouse name: Not on file  . Number of children: Not on file  . Years of education: Not on file  . Highest education level: Not on file  Occupational History  . Not on file  Social Needs  . Financial resource strain: Not on file  . Food insecurity:    Worry: Not on file    Inability: Not on file  . Transportation needs:    Medical: Not on file    Non-medical: Not on file  Tobacco Use  . Smoking status: Former Smoker    Last attempt to quit: 06/08/1979    Years since quitting: 38.7  . Smokeless tobacco: Never Used  Substance and Sexual Activity  . Alcohol use: Yes    Comment: Occasionally drink wine  . Drug use: No  . Sexual activity: Never  Lifestyle  . Physical activity:    Days per week: Not on file    Minutes per session: Not on file  . Stress: Not on file  Relationships  . Social connections:    Talks on phone: Not on file    Gets together: Not on file    Attends religious service: Not on file    Active member of club or organization: Not on file    Attends meetings of clubs or organizations: Not on file    Relationship status: Not on file  .  Intimate partner violence:    Fear of current or ex partner: Not on file    Emotionally abused: Not on file    Physically abused: Not on file    Forced sexual activity: Not on file  Other Topics Concern  . Not on file  Social History Narrative   Lives with daughter in a [place] home.   Single.  Two children.   Early retirement due to back problems in 2012.     Previously worked as a Counsellor for [employer].    Current Outpatient Medications on File Prior to Visit  Medication Sig Dispense Refill  . acetaminophen-codeine (TYLENOL #3) 300-30 MG per tablet Take one tablet bid prn pain 30 tablet 0  . ALPRAZolam (XANAX) 0.5 MG tablet Take 0.5 mg by mouth every 12 (twelve) hours as needed.    Marland Kitchen amLODipine (NORVASC) 5 MG tablet Take 5 mg by mouth daily.    Marland Kitchen atorvastatin (LIPITOR) 10 MG tablet Take one every other day 48 tablet 1  . losartan (COZAAR) 100 MG tablet Take 100 mg by mouth daily.    Marland Kitchen losartan-hydrochlorothiazide (HYZAAR) 100-25 MG per tablet  TAKE 1 TABLET BY MOUTH DAILY. 90 tablet 1  . methimazole (TAPAZOLE) 5 MG tablet Take 1 tablet (5 mg total) by mouth daily. 30 tablet 11  . naproxen (NAPROSYN) 500 MG tablet Take one tablet bid prn with food 20 tablet 3  . oxybutynin (DITROPAN XL) 5 MG 24 hr tablet Take one at bedtime to help with urination 30 tablet 1  . rosuvastatin (CRESTOR) 10 MG tablet Take 10 mg by mouth daily.  1  . tiZANidine (ZANAFLEX) 4 MG tablet Take 4 mg by mouth every 6 (six) hours as needed.    . Vitamin D, Ergocalciferol, (DRISDOL) 50000 UNITS CAPS capsule Take 1 capsule (50,000 Units total) by mouth every 7 (seven) days. 12 capsule 0  . potassium chloride SA (K-DUR,KLOR-CON) 20 MEQ tablet Take 20 mEq by mouth daily.  3   No current facility-administered medications on file prior to visit.     Allergies  Allergen Reactions  . Penicillins Itching    Family History  Problem Relation Age of Onset  . Hypertension Mother   . Heart  disease Mother        Deceased, 15  . Other Father        Deceased, 70s  . Hypertension Sister   . Hypertension Brother   . Healthy Daughter   . Thyroid disease Neg Hx     BP 126/68 (BP Location: Left Arm, Patient Position: Sitting, Cuff Size: Normal)   Pulse 68   Ht 5\' 6"  (1.676 m)   Wt 288 lb (130.6 kg)   SpO2 96%   BMI 46.48 kg/m   Review of Systems Denies fever    Objective:   Physical Exam VS: see vs page GEN: no distress NECK: thyroid is slightly enlarged, with multinodular surface     Assessment & Plan:  Hyperthyroidism: due for recheck Multinodular goiter: clinically stable  Patient Instructions  blood tests are requested for you today.  We'll let you know about the results.  If ever you have fever while taking methimazole, stop it and call us, even if the reason is obvious, because of the risk of a rare side-effect.   Please come back for a follow-up appointment in 3 months.

## 2018-02-09 NOTE — Patient Instructions (Addendum)
blood tests are requested for you today.  We'll let you know about the results. If ever you have fever while taking methimazole, stop it and call us, even if the reason is obvious, because of the risk of a rare side-effect.  Please come back for a follow-up appointment in 3 months.   

## 2018-03-05 MED FILL — AMLODIPINE BESYLATE 5 MG TA: 5 | 90 days supply | Qty: 90 | Fill #0

## 2018-03-05 MED FILL — NAPROXEN 500 MG TABLET: 500 | 30 days supply | Qty: 60 | Fill #0

## 2018-03-16 DIAGNOSIS — I83813 Varicose veins of bilateral lower extremities with pain: Secondary | ICD-10-CM | POA: Diagnosis not present

## 2018-03-16 DIAGNOSIS — I8312 Varicose veins of left lower extremity with inflammation: Secondary | ICD-10-CM | POA: Diagnosis not present

## 2018-03-16 DIAGNOSIS — I83891 Varicose veins of right lower extremities with other complications: Secondary | ICD-10-CM | POA: Diagnosis not present

## 2018-03-16 DIAGNOSIS — I83892 Varicose veins of left lower extremities with other complications: Secondary | ICD-10-CM | POA: Diagnosis not present

## 2018-03-16 DIAGNOSIS — I8311 Varicose veins of right lower extremity with inflammation: Secondary | ICD-10-CM | POA: Diagnosis not present

## 2018-03-18 MED FILL — methIMAzole 5 MG TABS: 5 | 30 days supply | Qty: 30 | Fill #2

## 2018-03-23 DIAGNOSIS — I8311 Varicose veins of right lower extremity with inflammation: Secondary | ICD-10-CM | POA: Diagnosis not present

## 2018-03-30 DIAGNOSIS — I8312 Varicose veins of left lower extremity with inflammation: Secondary | ICD-10-CM | POA: Diagnosis not present

## 2018-04-02 DIAGNOSIS — I8311 Varicose veins of right lower extremity with inflammation: Secondary | ICD-10-CM | POA: Diagnosis not present

## 2018-04-02 DIAGNOSIS — I83813 Varicose veins of bilateral lower extremities with pain: Secondary | ICD-10-CM | POA: Diagnosis not present

## 2018-04-02 DIAGNOSIS — I8312 Varicose veins of left lower extremity with inflammation: Secondary | ICD-10-CM | POA: Diagnosis not present

## 2018-04-02 DIAGNOSIS — I83893 Varicose veins of bilateral lower extremities with other complications: Secondary | ICD-10-CM | POA: Diagnosis not present

## 2018-04-15 MED FILL — LOSARTAN-HCTZ 100-25 MG TAB: 100-25 | 90 days supply | Qty: 90 | Fill #1

## 2018-04-15 MED FILL — methIMAzole 5 MG TABS: 5 | 30 days supply | Qty: 30 | Fill #3

## 2018-04-27 ENCOUNTER — Ambulatory Visit: Payer: Medicare HMO | Admitting: Endocrinology

## 2018-04-27 DIAGNOSIS — Z0289 Encounter for other administrative examinations: Secondary | ICD-10-CM

## 2018-05-04 DIAGNOSIS — I8311 Varicose veins of right lower extremity with inflammation: Secondary | ICD-10-CM | POA: Diagnosis not present

## 2018-05-04 DIAGNOSIS — I8312 Varicose veins of left lower extremity with inflammation: Secondary | ICD-10-CM | POA: Diagnosis not present

## 2018-05-21 MED FILL — ROSUVASTATIN CALCIUM 10 MG: 10 | 20 days supply | Qty: 8 | Fill #2

## 2018-06-01 ENCOUNTER — Ambulatory Visit (INDEPENDENT_AMBULATORY_CARE_PROVIDER_SITE_OTHER): Payer: Medicare HMO | Admitting: Endocrinology

## 2018-06-01 ENCOUNTER — Encounter: Payer: Self-pay | Admitting: Endocrinology

## 2018-06-01 VITALS — BP 132/64 | HR 80 | Ht 66.0 in | Wt 276.0 lb

## 2018-06-01 DIAGNOSIS — E0590 Thyrotoxicosis, unspecified without thyrotoxic crisis or storm: Secondary | ICD-10-CM

## 2018-06-01 LAB — T4, FREE: FREE T4: 0.94 ng/dL (ref 0.60–1.60)

## 2018-06-01 LAB — TSH: TSH: 1.42 u[IU]/mL (ref 0.35–4.50)

## 2018-06-01 NOTE — Patient Instructions (Signed)
blood tests are requested for you today.  We'll let you know about the results. If ever you have fever while taking methimazole, stop it and call us, even if the reason is obvious, because of the risk of a rare side-effect. Please come back for a follow-up appointment in 4 months.  

## 2018-06-01 NOTE — Progress Notes (Signed)
Subjective:    Patient ID: Melanie Orr, female    DOB: 08-15-1951, 66 y.o.   MRN: [id number]  HPI Pt returns for f/u of hyperthyroidism (dx'ed early 2019; US showed multiple bilateral nodules; Bx showed BENIGN FOLLICULAR NODULE (BETHESDA CATEGORY II); TSH suppression was mild, but she had a trial of rx, due to sxs).  pt states she feels well in general.  She takes tapazole as rx'ed.  She has lost weight, due to her efforts Past Medical History:  Diagnosis Date  . Arthritis   . Bulging disc   . Hypertension   . Neuromuscular disorder ([place])   . Pituitary adenoma ([place])   . Scoliosis   . Spinal stenosis of lumbar region   . Transverse myelitis ([place]) 2004    Past Surgical History:  Procedure Laterality Date  . ABDOMINAL HYSTERECTOMY    . TRANSPHENOIDAL PITUITARY RESECTION  2004    Social History   Socioeconomic History  . Marital status: Single    Spouse name: Not on file  . Number of children: Not on file  . Years of education: Not on file  . Highest education level: Not on file  Occupational History  . Not on file  Social Needs  . Financial resource strain: Not on file  . Food insecurity:    Worry: Not on file    Inability: Not on file  . Transportation needs:    Medical: Not on file    Non-medical: Not on file  Tobacco Use  . Smoking status: Former Smoker    Last attempt to quit: 06/08/1979    Years since quitting: 39.0  . Smokeless tobacco: Never Used  Substance and Sexual Activity  . Alcohol use: Yes    Comment: Occasionally drink wine  . Drug use: No  . Sexual activity: Never  Lifestyle  . Physical activity:    Days per week: Not on file    Minutes per session: Not on file  . Stress: Not on file  Relationships  . Social connections:    Talks on phone: Not on file    Gets together: Not on file    Attends religious service: Not on file    Active member of club or organization: Not on file    Attends meetings of clubs or organizations: Not on file    Relationship status: Not on file  . Intimate partner violence:    Fear of current or ex partner: Not on file    Emotionally abused: Not on file    Physically abused: Not on file    Forced sexual activity: Not on file  Other Topics Concern  . Not on file  Social History Narrative   Lives with daughter in a [place] home.   Single.  Two children.   Early retirement due to back problems in 2012.     Previously worked as a Counsellor for [employer].    Current Outpatient Medications on File Prior to Visit  Medication Sig Dispense Refill  . ALPRAZolam (XANAX) 0.5 MG tablet Take 0.5 mg by mouth every 12 (twelve) hours as needed.    Marland Kitchen amLODipine (NORVASC) 5 MG tablet Take 5 mg by mouth daily.    Marland Kitchen losartan-hydrochlorothiazide (HYZAAR) 100-25 MG per tablet TAKE 1 TABLET BY MOUTH DAILY. 90 tablet 1  . methimazole (TAPAZOLE) 5 MG tablet Take 1 tablet (5 mg total) by mouth daily. 30 tablet 11  . naproxen (NAPROSYN) 500 MG tablet Take one tablet bid prn with  food 20 tablet 3  . acetaminophen-codeine (TYLENOL #3) 300-30 MG per tablet Take one tablet bid prn pain (Patient not taking: Reported on 06/01/2018) 30 tablet 0  . atorvastatin (LIPITOR) 10 MG tablet Take one every other day 48 tablet 1  . potassium chloride SA (K-DUR,KLOR-CON) 20 MEQ tablet Take 20 mEq by mouth daily.  3  . rosuvastatin (CRESTOR) 10 MG tablet Take 10 mg by mouth daily.  1  . tiZANidine (ZANAFLEX) 4 MG tablet Take 4 mg by mouth every 6 (six) hours as needed.    . Vitamin D, Ergocalciferol, (DRISDOL) 50000 UNITS CAPS capsule Take 1 capsule (50,000 Units total) by mouth every 7 (seven) days. (Patient not taking: Reported on 06/01/2018) 12 capsule 0   No current facility-administered medications on file prior to visit.     Allergies  Allergen Reactions  . Penicillins Itching    Family History  Problem Relation Age of Onset  . Hypertension Mother   . Heart disease Mother        Deceased, 6  . Other  Father        Deceased, 73s  . Hypertension Sister   . Hypertension Brother   . Healthy Daughter   . Thyroid disease Neg Hx     BP 132/64   Pulse 80   Ht 5\' 6"  (1.676 m)   Wt 276 lb (125.2 kg)   SpO2 95%   BMI 44.55 kg/m    Review of Systems Denies fever.      Objective:   Physical Exam VS: see vs page GEN: no distress NECK: thyroid is slightly enlarged, with multinodular surface.     Lab Results  Component Value Date   TSH 1.42 06/01/2018       Assessment & Plan:  Hyperthyroidism: well-controlled Multinodular goiter: clinically stable  Patient Instructions  blood tests are requested for you today.  We'll let you know about the results.  If ever you have fever while taking methimazole, stop it and call us, even if the reason is obvious, because of the risk of a rare side-effect.   Please come back for a follow-up appointment in 4 months.

## 2018-06-08 MED FILL — methIMAzole 5 MG TABS: 5 | 30 days supply | Qty: 30 | Fill #4

## 2018-06-29 DIAGNOSIS — I83891 Varicose veins of right lower extremities with other complications: Secondary | ICD-10-CM | POA: Diagnosis not present

## 2018-06-29 DIAGNOSIS — I8311 Varicose veins of right lower extremity with inflammation: Secondary | ICD-10-CM | POA: Diagnosis not present

## 2018-07-13 DIAGNOSIS — I8311 Varicose veins of right lower extremity with inflammation: Secondary | ICD-10-CM | POA: Diagnosis not present

## 2018-07-13 DIAGNOSIS — I83891 Varicose veins of right lower extremities with other complications: Secondary | ICD-10-CM | POA: Diagnosis not present

## 2018-07-27 DIAGNOSIS — I8311 Varicose veins of right lower extremity with inflammation: Secondary | ICD-10-CM | POA: Diagnosis not present

## 2018-07-27 DIAGNOSIS — I83891 Varicose veins of right lower extremities with other complications: Secondary | ICD-10-CM | POA: Diagnosis not present

## 2018-08-11 MED FILL — AMLODIPINE BESYLATE 5 MG TA: 5 | 90 days supply | Qty: 90 | Fill #1

## 2018-09-07 DIAGNOSIS — I83811 Varicose veins of right lower extremities with pain: Secondary | ICD-10-CM | POA: Diagnosis not present

## 2018-09-07 DIAGNOSIS — I8311 Varicose veins of right lower extremity with inflammation: Secondary | ICD-10-CM | POA: Diagnosis not present

## 2018-09-07 DIAGNOSIS — M7981 Nontraumatic hematoma of soft tissue: Secondary | ICD-10-CM | POA: Diagnosis not present

## 2018-09-18 MED FILL — LOSARTAN-HCTZ 100-25 MG TAB: 100-25 | 90 days supply | Qty: 90 | Fill #2

## 2018-10-05 ENCOUNTER — Ambulatory Visit: Payer: Medicare HMO | Admitting: Endocrinology

## 2018-11-24 DIAGNOSIS — M25562 Pain in left knee: Secondary | ICD-10-CM | POA: Diagnosis not present

## 2018-11-24 DIAGNOSIS — M1712 Unilateral primary osteoarthritis, left knee: Secondary | ICD-10-CM | POA: Diagnosis not present

## 2018-11-24 DIAGNOSIS — M170 Bilateral primary osteoarthritis of knee: Secondary | ICD-10-CM | POA: Diagnosis not present

## 2018-11-24 DIAGNOSIS — M25561 Pain in right knee: Secondary | ICD-10-CM | POA: Diagnosis not present

## 2018-11-24 DIAGNOSIS — M1711 Unilateral primary osteoarthritis, right knee: Secondary | ICD-10-CM | POA: Diagnosis not present

## 2019-02-15 MED FILL — AMLODIPINE BESYLATE 5 MG TA: 5 | 90 days supply | Qty: 90 | Fill #2

## 2019-02-15 MED FILL — HYDROCHLOROTHIAZIDE 25 MG T: 25 | 90 days supply | Qty: 90 | Fill #0

## 2019-02-15 MED FILL — LOSARTAN POTASSIUM 100 MG T: 100 | 90 days supply | Qty: 90 | Fill #0

## 2019-06-01 DIAGNOSIS — M1711 Unilateral primary osteoarthritis, right knee: Secondary | ICD-10-CM | POA: Diagnosis not present

## 2019-06-01 DIAGNOSIS — I10 Essential (primary) hypertension: Secondary | ICD-10-CM | POA: Diagnosis not present

## 2019-06-01 DIAGNOSIS — Z01810 Encounter for preprocedural cardiovascular examination: Secondary | ICD-10-CM | POA: Diagnosis not present

## 2019-06-02 DIAGNOSIS — Z01818 Encounter for other preprocedural examination: Secondary | ICD-10-CM | POA: Diagnosis not present

## 2019-06-02 DIAGNOSIS — I10 Essential (primary) hypertension: Secondary | ICD-10-CM | POA: Diagnosis not present

## 2019-06-02 DIAGNOSIS — Z01810 Encounter for preprocedural cardiovascular examination: Secondary | ICD-10-CM | POA: Diagnosis not present

## 2019-06-02 DIAGNOSIS — M1711 Unilateral primary osteoarthritis, right knee: Secondary | ICD-10-CM | POA: Diagnosis not present

## 2019-06-03 DIAGNOSIS — I517 Cardiomegaly: Secondary | ICD-10-CM | POA: Diagnosis not present

## 2019-06-03 DIAGNOSIS — I447 Left bundle-branch block, unspecified: Secondary | ICD-10-CM | POA: Diagnosis not present

## 2019-06-03 DIAGNOSIS — I440 Atrioventricular block, first degree: Secondary | ICD-10-CM | POA: Diagnosis not present

## 2019-06-11 DIAGNOSIS — Z01812 Encounter for preprocedural laboratory examination: Secondary | ICD-10-CM | POA: Diagnosis not present

## 2019-06-11 DIAGNOSIS — M1711 Unilateral primary osteoarthritis, right knee: Secondary | ICD-10-CM | POA: Diagnosis not present

## 2019-06-11 DIAGNOSIS — Z20828 Contact with and (suspected) exposure to other viral communicable diseases: Secondary | ICD-10-CM | POA: Diagnosis not present

## 2019-06-15 DIAGNOSIS — E7800 Pure hypercholesterolemia, unspecified: Secondary | ICD-10-CM | POA: Diagnosis not present

## 2019-06-15 DIAGNOSIS — M1711 Unilateral primary osteoarthritis, right knee: Secondary | ICD-10-CM | POA: Diagnosis not present

## 2019-06-15 DIAGNOSIS — Z23 Encounter for immunization: Secondary | ICD-10-CM | POA: Diagnosis not present

## 2019-06-15 DIAGNOSIS — Z86711 Personal history of pulmonary embolism: Secondary | ICD-10-CM | POA: Diagnosis not present

## 2019-06-15 DIAGNOSIS — E0590 Thyrotoxicosis, unspecified without thyrotoxic crisis or storm: Secondary | ICD-10-CM | POA: Diagnosis not present

## 2019-06-15 DIAGNOSIS — I10 Essential (primary) hypertension: Secondary | ICD-10-CM | POA: Diagnosis not present

## 2019-06-17 MED FILL — ROSUVASTATIN CALCIUM 10 MG: 10 | 30 days supply | Qty: 30 | Fill #0

## 2019-06-21 MED FILL — traZODone HCL 50 MG TABS: 50 | 90 days supply | Qty: 45 | Fill #0

## 2019-07-15 MED FILL — LOSARTAN POTASSIUM 100 MG T: 100 | 90 days supply | Qty: 90 | Fill #1

## 2019-08-11 MED FILL — ROSUVASTATIN CALCIUM 10 MG: 10 | 30 days supply | Qty: 30 | Fill #1

## 2019-08-13 MED FILL — AMLODIPINE BESYLATE 5 MG TA: 5 | 90 days supply | Qty: 90 | Fill #0

## 2019-11-15 MED FILL — ROSUVASTATIN CALCIUM 10 MG: 10 | 30 days supply | Qty: 30 | Fill #2

## 2019-12-27 ENCOUNTER — Other Ambulatory Visit (HOSPITAL_BASED_OUTPATIENT_CLINIC_OR_DEPARTMENT_OTHER): Payer: Self-pay | Admitting: Internal Medicine

## 2019-12-27 MED FILL — LOSARTAN-HCTZ 100-25 MG TAB: 100-25 | 90 days supply | Qty: 90 | Fill #0

## 2020-01-12 MED FILL — AMLODIPINE BESYLATE 5 MG TA: 5 | 90 days supply | Qty: 90 | Fill #1

## 2020-01-25 DIAGNOSIS — H04123 Dry eye syndrome of bilateral lacrimal glands: Secondary | ICD-10-CM | POA: Diagnosis not present

## 2020-01-25 DIAGNOSIS — H524 Presbyopia: Secondary | ICD-10-CM | POA: Diagnosis not present

## 2020-01-25 DIAGNOSIS — H52223 Regular astigmatism, bilateral: Secondary | ICD-10-CM | POA: Diagnosis not present

## 2020-01-25 DIAGNOSIS — H25813 Combined forms of age-related cataract, bilateral: Secondary | ICD-10-CM | POA: Diagnosis not present

## 2020-02-17 DIAGNOSIS — H25811 Combined forms of age-related cataract, right eye: Secondary | ICD-10-CM | POA: Diagnosis not present

## 2020-02-17 DIAGNOSIS — H25813 Combined forms of age-related cataract, bilateral: Secondary | ICD-10-CM | POA: Diagnosis not present

## 2020-02-17 DIAGNOSIS — Z01818 Encounter for other preprocedural examination: Secondary | ICD-10-CM | POA: Diagnosis not present

## 2020-02-17 DIAGNOSIS — H2512 Age-related nuclear cataract, left eye: Secondary | ICD-10-CM | POA: Diagnosis not present

## 2020-02-17 DIAGNOSIS — H25812 Combined forms of age-related cataract, left eye: Secondary | ICD-10-CM | POA: Diagnosis not present

## 2020-03-09 DIAGNOSIS — H2511 Age-related nuclear cataract, right eye: Secondary | ICD-10-CM | POA: Diagnosis not present

## 2020-03-09 DIAGNOSIS — H02889 Meibomian gland dysfunction of unspecified eye, unspecified eyelid: Secondary | ICD-10-CM | POA: Diagnosis not present

## 2020-03-09 DIAGNOSIS — H25811 Combined forms of age-related cataract, right eye: Secondary | ICD-10-CM | POA: Diagnosis not present

## 2020-03-09 MED FILL — PREDNISOLONE AC 1% EYE DROP: 1 | 28 days supply | Qty: 5 | Fill #0

## 2020-03-09 MED FILL — OFLOXACIN 0.3% EYE DROPS: 0.3 | 28 days supply | Qty: 5 | Fill #0

## 2020-05-26 MED FILL — AMLODIPINE BESYLATE 5 MG TA: 5 | 90 days supply | Qty: 90 | Fill #0

## 2020-05-26 MED FILL — LOSARTAN-HCTZ 100-25 MG TAB: 100-25 | 90 days supply | Qty: 90 | Fill #1

## 2020-08-25 MED FILL — LOSARTAN-HCTZ 100-25 MG TAB: 100-25 | 30 days supply | Qty: 30 | Fill #2

## 2020-11-17 MED FILL — LOSARTAN-HCTZ 100-25 MG TAB: 100-25 | 90 days supply | Qty: 90 | Fill #2

## 2020-12-26 ENCOUNTER — Other Ambulatory Visit (HOSPITAL_BASED_OUTPATIENT_CLINIC_OR_DEPARTMENT_OTHER): Payer: Self-pay

## 2020-12-29 ENCOUNTER — Other Ambulatory Visit (HOSPITAL_BASED_OUTPATIENT_CLINIC_OR_DEPARTMENT_OTHER): Payer: Self-pay

## 2021-01-29 ENCOUNTER — Other Ambulatory Visit (HOSPITAL_BASED_OUTPATIENT_CLINIC_OR_DEPARTMENT_OTHER): Payer: Self-pay

## 2021-01-29 DIAGNOSIS — F419 Anxiety disorder, unspecified: Secondary | ICD-10-CM | POA: Diagnosis not present

## 2021-01-29 DIAGNOSIS — M1711 Unilateral primary osteoarthritis, right knee: Secondary | ICD-10-CM | POA: Diagnosis not present

## 2021-01-29 DIAGNOSIS — Z79899 Other long term (current) drug therapy: Secondary | ICD-10-CM | POA: Diagnosis not present

## 2021-01-29 DIAGNOSIS — I10 Essential (primary) hypertension: Secondary | ICD-10-CM | POA: Diagnosis not present

## 2021-01-29 DIAGNOSIS — G4730 Sleep apnea, unspecified: Secondary | ICD-10-CM | POA: Diagnosis not present

## 2021-01-29 DIAGNOSIS — E0590 Thyrotoxicosis, unspecified without thyrotoxic crisis or storm: Secondary | ICD-10-CM | POA: Diagnosis not present

## 2021-01-29 DIAGNOSIS — Z0001 Encounter for general adult medical examination with abnormal findings: Secondary | ICD-10-CM | POA: Diagnosis not present

## 2021-01-29 DIAGNOSIS — G4700 Insomnia, unspecified: Secondary | ICD-10-CM | POA: Diagnosis not present

## 2021-01-29 MED ORDER — DICLOFENAC SODIUM 1 % EX GEL
CUTANEOUS | 11 refills | Status: AC
  Filled 2021-01-29: qty 100, 25d supply, fill #0

## 2021-01-29 MED ORDER — LOSARTAN POTASSIUM-HCTZ 100-25 MG PO TABS
1.0000 | ORAL_TABLET | Freq: Every day | ORAL | 3 refills | Status: DC
  Filled 2021-01-29 – 2021-01-31 (×2): qty 90, 90d supply, fill #0
  Filled 2021-06-22: qty 90, 90d supply, fill #1
  Filled 2021-12-21: qty 90, 90d supply, fill #2

## 2021-01-29 MED ORDER — TRAZODONE HCL 50 MG PO TABS
ORAL_TABLET | ORAL | 1 refills | Status: DC
  Filled 2021-01-29: qty 60, 60d supply, fill #0

## 2021-01-29 MED ORDER — ACETAMINOPHEN 500 MG PO TABS
ORAL_TABLET | ORAL | 11 refills | Status: AC
  Filled 2021-01-29: qty 100, 25d supply, fill #0
  Filled 2021-11-26: qty 100, 25d supply, fill #1

## 2021-01-29 MED ORDER — AMLODIPINE BESYLATE 5 MG PO TABS
ORAL_TABLET | ORAL | 3 refills | Status: AC
  Filled 2021-01-29: qty 90, 90d supply, fill #0
  Filled 2021-06-22: qty 90, 90d supply, fill #1
  Filled 2021-12-21: qty 90, 90d supply, fill #2

## 2021-01-31 ENCOUNTER — Other Ambulatory Visit (HOSPITAL_BASED_OUTPATIENT_CLINIC_OR_DEPARTMENT_OTHER): Payer: Self-pay

## 2021-02-01 ENCOUNTER — Other Ambulatory Visit (HOSPITAL_BASED_OUTPATIENT_CLINIC_OR_DEPARTMENT_OTHER): Payer: Self-pay

## 2021-02-01 MED ORDER — ATORVASTATIN CALCIUM 10 MG PO TABS
1.0000 | ORAL_TABLET | Freq: Every day | ORAL | 0 refills | Status: AC
  Filled 2021-02-01: qty 90, 90d supply, fill #0

## 2021-02-09 ENCOUNTER — Other Ambulatory Visit (HOSPITAL_BASED_OUTPATIENT_CLINIC_OR_DEPARTMENT_OTHER): Payer: Self-pay

## 2021-02-19 ENCOUNTER — Other Ambulatory Visit (HOSPITAL_BASED_OUTPATIENT_CLINIC_OR_DEPARTMENT_OTHER): Payer: Self-pay

## 2021-04-30 ENCOUNTER — Other Ambulatory Visit (HOSPITAL_BASED_OUTPATIENT_CLINIC_OR_DEPARTMENT_OTHER): Payer: Self-pay

## 2021-04-30 DIAGNOSIS — R413 Other amnesia: Secondary | ICD-10-CM | POA: Diagnosis not present

## 2021-04-30 DIAGNOSIS — M1711 Unilateral primary osteoarthritis, right knee: Secondary | ICD-10-CM | POA: Diagnosis not present

## 2021-04-30 DIAGNOSIS — I10 Essential (primary) hypertension: Secondary | ICD-10-CM | POA: Diagnosis not present

## 2021-04-30 DIAGNOSIS — E0590 Thyrotoxicosis, unspecified without thyrotoxic crisis or storm: Secondary | ICD-10-CM | POA: Diagnosis not present

## 2021-04-30 DIAGNOSIS — F419 Anxiety disorder, unspecified: Secondary | ICD-10-CM | POA: Diagnosis not present

## 2021-04-30 DIAGNOSIS — G4700 Insomnia, unspecified: Secondary | ICD-10-CM | POA: Diagnosis not present

## 2021-04-30 DIAGNOSIS — R634 Abnormal weight loss: Secondary | ICD-10-CM | POA: Diagnosis not present

## 2021-04-30 MED ORDER — TRAZODONE HCL 100 MG PO TABS
ORAL_TABLET | ORAL | 3 refills | Status: DC
  Filled 2021-04-30: qty 90, 90d supply, fill #0

## 2021-04-30 MED ORDER — MELOXICAM 15 MG PO TABS
ORAL_TABLET | ORAL | 0 refills | Status: AC
  Filled 2021-04-30: qty 90, 90d supply, fill #0

## 2021-06-11 ENCOUNTER — Other Ambulatory Visit (HOSPITAL_BASED_OUTPATIENT_CLINIC_OR_DEPARTMENT_OTHER): Payer: Self-pay

## 2021-06-11 DIAGNOSIS — E876 Hypokalemia: Secondary | ICD-10-CM | POA: Diagnosis not present

## 2021-06-11 DIAGNOSIS — I10 Essential (primary) hypertension: Secondary | ICD-10-CM | POA: Diagnosis not present

## 2021-06-11 DIAGNOSIS — F5104 Psychophysiologic insomnia: Secondary | ICD-10-CM | POA: Diagnosis not present

## 2021-06-11 DIAGNOSIS — G4709 Other insomnia: Secondary | ICD-10-CM | POA: Diagnosis not present

## 2021-06-11 DIAGNOSIS — Z0000 Encounter for general adult medical examination without abnormal findings: Secondary | ICD-10-CM | POA: Diagnosis not present

## 2021-06-11 DIAGNOSIS — H539 Unspecified visual disturbance: Secondary | ICD-10-CM | POA: Diagnosis not present

## 2021-06-11 DIAGNOSIS — M1711 Unilateral primary osteoarthritis, right knee: Secondary | ICD-10-CM | POA: Diagnosis not present

## 2021-06-11 DIAGNOSIS — N3000 Acute cystitis without hematuria: Secondary | ICD-10-CM | POA: Diagnosis not present

## 2021-06-11 DIAGNOSIS — Z79899 Other long term (current) drug therapy: Secondary | ICD-10-CM | POA: Diagnosis not present

## 2021-06-11 DIAGNOSIS — F419 Anxiety disorder, unspecified: Secondary | ICD-10-CM | POA: Diagnosis not present

## 2021-06-11 DIAGNOSIS — N3943 Post-void dribbling: Secondary | ICD-10-CM | POA: Diagnosis not present

## 2021-06-11 DIAGNOSIS — Z791 Long term (current) use of non-steroidal anti-inflammatories (NSAID): Secondary | ICD-10-CM | POA: Diagnosis not present

## 2021-06-11 DIAGNOSIS — R8290 Unspecified abnormal findings in urine: Secondary | ICD-10-CM | POA: Diagnosis not present

## 2021-06-11 MED ORDER — SULFAMETHOXAZOLE-TRIMETHOPRIM 800-160 MG PO TABS
ORAL_TABLET | ORAL | 0 refills | Status: DC
  Filled 2021-06-11: qty 14, 7d supply, fill #0

## 2021-06-15 ENCOUNTER — Other Ambulatory Visit (HOSPITAL_BASED_OUTPATIENT_CLINIC_OR_DEPARTMENT_OTHER): Payer: Self-pay

## 2021-06-15 MED ORDER — BUSPIRONE HCL 5 MG PO TABS
ORAL_TABLET | ORAL | 1 refills | Status: DC
  Filled 2021-06-15: qty 60, 30d supply, fill #0

## 2021-06-20 ENCOUNTER — Other Ambulatory Visit (HOSPITAL_BASED_OUTPATIENT_CLINIC_OR_DEPARTMENT_OTHER): Payer: Self-pay

## 2021-06-21 ENCOUNTER — Other Ambulatory Visit (HOSPITAL_BASED_OUTPATIENT_CLINIC_OR_DEPARTMENT_OTHER): Payer: Self-pay

## 2021-06-22 ENCOUNTER — Other Ambulatory Visit (HOSPITAL_BASED_OUTPATIENT_CLINIC_OR_DEPARTMENT_OTHER): Payer: Self-pay

## 2021-06-25 ENCOUNTER — Other Ambulatory Visit (HOSPITAL_BASED_OUTPATIENT_CLINIC_OR_DEPARTMENT_OTHER): Payer: Self-pay

## 2021-08-06 ENCOUNTER — Other Ambulatory Visit (HOSPITAL_BASED_OUTPATIENT_CLINIC_OR_DEPARTMENT_OTHER): Payer: Self-pay

## 2021-08-06 DIAGNOSIS — R413 Other amnesia: Secondary | ICD-10-CM | POA: Diagnosis not present

## 2021-08-06 DIAGNOSIS — M1711 Unilateral primary osteoarthritis, right knee: Secondary | ICD-10-CM | POA: Diagnosis not present

## 2021-08-06 DIAGNOSIS — G4709 Other insomnia: Secondary | ICD-10-CM | POA: Diagnosis not present

## 2021-08-06 DIAGNOSIS — I10 Essential (primary) hypertension: Secondary | ICD-10-CM | POA: Diagnosis not present

## 2021-08-06 DIAGNOSIS — F419 Anxiety disorder, unspecified: Secondary | ICD-10-CM | POA: Diagnosis not present

## 2021-08-06 DIAGNOSIS — E559 Vitamin D deficiency, unspecified: Secondary | ICD-10-CM | POA: Diagnosis not present

## 2021-08-06 DIAGNOSIS — N3000 Acute cystitis without hematuria: Secondary | ICD-10-CM | POA: Diagnosis not present

## 2021-08-06 DIAGNOSIS — H539 Unspecified visual disturbance: Secondary | ICD-10-CM | POA: Diagnosis not present

## 2021-08-06 DIAGNOSIS — N3943 Post-void dribbling: Secondary | ICD-10-CM | POA: Diagnosis not present

## 2021-08-06 MED ORDER — DICLOFENAC SODIUM 1 % EX GEL
CUTANEOUS | 11 refills | Status: DC
  Filled 2021-08-06: qty 100, 25d supply, fill #0

## 2021-08-06 MED ORDER — POTASSIUM CHLORIDE CRYS ER 20 MEQ PO TBCR
20.0000 meq | EXTENDED_RELEASE_TABLET | Freq: Every day | ORAL | 0 refills | Status: DC
  Filled 2021-08-06: qty 90, 90d supply, fill #0

## 2021-08-06 MED ORDER — BUSPIRONE HCL 10 MG PO TABS
ORAL_TABLET | ORAL | 0 refills | Status: DC
  Filled 2021-08-06: qty 180, 90d supply, fill #0

## 2021-08-06 MED ORDER — SULFAMETHOXAZOLE-TRIMETHOPRIM 800-160 MG PO TABS
ORAL_TABLET | ORAL | 0 refills | Status: AC
  Filled 2021-08-06: qty 14, 7d supply, fill #0

## 2021-08-06 MED ORDER — TRAMADOL HCL 50 MG PO TABS
ORAL_TABLET | ORAL | 0 refills | Status: AC
  Filled 2021-08-06: qty 30, 15d supply, fill #0

## 2021-08-06 MED ORDER — SULFAMETHOXAZOLE-TRIMETHOPRIM 800-160 MG PO TABS
ORAL_TABLET | ORAL | 0 refills | Status: AC
  Filled 2021-08-06 (×2): qty 14, 7d supply, fill #0

## 2021-08-06 MED ORDER — ESCITALOPRAM OXALATE 10 MG PO TABS
ORAL_TABLET | ORAL | 0 refills | Status: DC
  Filled 2021-08-06: qty 90, 90d supply, fill #0

## 2021-11-13 ENCOUNTER — Other Ambulatory Visit (HOSPITAL_BASED_OUTPATIENT_CLINIC_OR_DEPARTMENT_OTHER): Payer: Self-pay

## 2021-11-13 MED ORDER — ATORVASTATIN CALCIUM 10 MG PO TABS
10.0000 mg | ORAL_TABLET | Freq: Every day | ORAL | 0 refills | Status: DC
  Filled 2021-11-13: qty 90, 90d supply, fill #0

## 2021-11-20 ENCOUNTER — Other Ambulatory Visit (HOSPITAL_BASED_OUTPATIENT_CLINIC_OR_DEPARTMENT_OTHER): Payer: Self-pay

## 2021-11-26 ENCOUNTER — Other Ambulatory Visit (HOSPITAL_BASED_OUTPATIENT_CLINIC_OR_DEPARTMENT_OTHER): Payer: Self-pay

## 2021-11-26 MED ORDER — TRAZODONE HCL 100 MG PO TABS
ORAL_TABLET | ORAL | 0 refills | Status: AC
  Filled 2021-11-26: qty 90, 90d supply, fill #0

## 2021-11-26 MED ORDER — ESCITALOPRAM OXALATE 5 MG PO TABS
5.0000 mg | ORAL_TABLET | Freq: Every day | ORAL | 0 refills | Status: DC
  Filled 2021-11-26: qty 90, 90d supply, fill #0

## 2021-11-27 ENCOUNTER — Other Ambulatory Visit (HOSPITAL_BASED_OUTPATIENT_CLINIC_OR_DEPARTMENT_OTHER): Payer: Self-pay

## 2021-12-06 ENCOUNTER — Other Ambulatory Visit (HOSPITAL_BASED_OUTPATIENT_CLINIC_OR_DEPARTMENT_OTHER): Payer: Self-pay

## 2021-12-07 ENCOUNTER — Other Ambulatory Visit (HOSPITAL_BASED_OUTPATIENT_CLINIC_OR_DEPARTMENT_OTHER): Payer: Self-pay

## 2021-12-21 ENCOUNTER — Other Ambulatory Visit (HOSPITAL_BASED_OUTPATIENT_CLINIC_OR_DEPARTMENT_OTHER): Payer: Self-pay

## 2022-04-09 ENCOUNTER — Other Ambulatory Visit (HOSPITAL_BASED_OUTPATIENT_CLINIC_OR_DEPARTMENT_OTHER): Payer: Self-pay

## 2022-04-09 MED ORDER — LINZESS 72 MCG PO CAPS
72.0000 ug | ORAL_CAPSULE | Freq: Every day | ORAL | 2 refills | Status: AC
  Filled 2022-04-09: qty 30, 30d supply, fill #0
  Filled 2022-06-19: qty 30, 30d supply, fill #1
  Filled 2022-08-29: qty 30, 30d supply, fill #2

## 2022-04-09 MED ORDER — TRAMADOL HCL 50 MG PO TABS
50.0000 mg | ORAL_TABLET | Freq: Three times a day (TID) | ORAL | 0 refills | Status: AC | PRN
  Filled 2022-04-09: qty 30, 10d supply, fill #0

## 2022-05-20 ENCOUNTER — Other Ambulatory Visit (HOSPITAL_BASED_OUTPATIENT_CLINIC_OR_DEPARTMENT_OTHER): Payer: Self-pay

## 2022-05-21 ENCOUNTER — Other Ambulatory Visit (HOSPITAL_BASED_OUTPATIENT_CLINIC_OR_DEPARTMENT_OTHER): Payer: Self-pay

## 2022-05-22 ENCOUNTER — Other Ambulatory Visit (HOSPITAL_BASED_OUTPATIENT_CLINIC_OR_DEPARTMENT_OTHER): Payer: Self-pay

## 2022-05-22 MED ORDER — LOSARTAN POTASSIUM-HCTZ 100-25 MG PO TABS
1.0000 | ORAL_TABLET | Freq: Every day | ORAL | 1 refills | Status: AC
  Filled 2022-05-22: qty 90, 90d supply, fill #0
  Filled 2023-01-09: qty 90, 90d supply, fill #1

## 2022-05-22 MED ORDER — TRAZODONE HCL 100 MG PO TABS
100.0000 mg | ORAL_TABLET | Freq: Every evening | ORAL | 1 refills | Status: DC
  Filled 2022-05-22: qty 90, 90d supply, fill #0
  Filled 2022-07-09 – 2022-08-14 (×2): qty 90, 90d supply, fill #1

## 2022-05-22 MED ORDER — AMLODIPINE BESYLATE 5 MG PO TABS
5.0000 mg | ORAL_TABLET | ORAL | 1 refills | Status: DC
  Filled 2022-05-22: qty 90, 90d supply, fill #0
  Filled 2022-09-23 (×2): qty 90, 90d supply, fill #1

## 2022-06-19 ENCOUNTER — Other Ambulatory Visit (HOSPITAL_BASED_OUTPATIENT_CLINIC_OR_DEPARTMENT_OTHER): Payer: Self-pay

## 2022-07-09 ENCOUNTER — Other Ambulatory Visit (HOSPITAL_BASED_OUTPATIENT_CLINIC_OR_DEPARTMENT_OTHER): Payer: Self-pay

## 2022-07-10 ENCOUNTER — Other Ambulatory Visit (HOSPITAL_BASED_OUTPATIENT_CLINIC_OR_DEPARTMENT_OTHER): Payer: Self-pay

## 2022-07-15 ENCOUNTER — Other Ambulatory Visit (HOSPITAL_BASED_OUTPATIENT_CLINIC_OR_DEPARTMENT_OTHER): Payer: Self-pay

## 2022-07-15 MED ORDER — LINZESS 72 MCG PO CAPS: ORAL_CAPSULE | ORAL | 2 refills | Status: AC

## 2022-07-15 MED ORDER — LORAZEPAM 0.5 MG PO TABS
0.5000 mg | ORAL_TABLET | Freq: Every day | ORAL | 0 refills | Status: AC | PRN
  Filled 2022-07-15: qty 30, 30d supply, fill #0

## 2022-07-15 MED ORDER — ATORVASTATIN CALCIUM 20 MG PO TABS
20.0000 mg | ORAL_TABLET | Freq: Every day | ORAL | 3 refills | Status: DC
  Filled 2022-07-15: qty 90, 90d supply, fill #0

## 2022-07-15 MED ORDER — MELOXICAM 15 MG PO TABS: 15.0000 mg | ORAL_TABLET | Freq: Every day | ORAL | 0 refills | Status: DC | PRN

## 2022-07-29 ENCOUNTER — Other Ambulatory Visit (HOSPITAL_BASED_OUTPATIENT_CLINIC_OR_DEPARTMENT_OTHER): Payer: Self-pay

## 2022-07-29 MED ORDER — ELIQUIS 5 MG PO TABS
5.0000 mg | ORAL_TABLET | Freq: Two times a day (BID) | ORAL | 0 refills | Status: DC
  Filled 2022-07-29: qty 60, 30d supply, fill #0

## 2022-07-29 MED ORDER — ATORVASTATIN CALCIUM 80 MG PO TABS
80.0000 mg | ORAL_TABLET | Freq: Every day | ORAL | 0 refills | Status: DC
  Filled 2022-07-29: qty 90, 90d supply, fill #0

## 2022-07-29 MED ORDER — METOPROLOL TARTRATE 25 MG PO TABS
12.5000 mg | ORAL_TABLET | Freq: Two times a day (BID) | ORAL | 0 refills | Status: DC
  Filled 2022-07-29: qty 30, 30d supply, fill #0

## 2022-08-12 ENCOUNTER — Other Ambulatory Visit (HOSPITAL_BASED_OUTPATIENT_CLINIC_OR_DEPARTMENT_OTHER): Payer: Self-pay

## 2022-08-12 MED ORDER — METOPROLOL TARTRATE 25 MG PO TABS
12.5000 mg | ORAL_TABLET | Freq: Two times a day (BID) | ORAL | 1 refills | Status: AC
  Filled 2022-08-12 – 2022-08-13 (×2): qty 30, 30d supply, fill #0
  Filled 2022-09-23 (×2): qty 30, 30d supply, fill #1

## 2022-08-13 ENCOUNTER — Other Ambulatory Visit (HOSPITAL_BASED_OUTPATIENT_CLINIC_OR_DEPARTMENT_OTHER): Payer: Self-pay

## 2022-08-14 ENCOUNTER — Other Ambulatory Visit (HOSPITAL_BASED_OUTPATIENT_CLINIC_OR_DEPARTMENT_OTHER): Payer: Self-pay

## 2022-08-28 ENCOUNTER — Other Ambulatory Visit (HOSPITAL_BASED_OUTPATIENT_CLINIC_OR_DEPARTMENT_OTHER): Payer: Self-pay

## 2022-09-04 ENCOUNTER — Other Ambulatory Visit (HOSPITAL_BASED_OUTPATIENT_CLINIC_OR_DEPARTMENT_OTHER): Payer: Self-pay

## 2022-09-04 MED ORDER — ELIQUIS 5 MG PO TABS
5.0000 mg | ORAL_TABLET | Freq: Two times a day (BID) | ORAL | 1 refills | Status: DC
  Filled 2022-09-04: qty 60, 30d supply, fill #0
  Filled 2022-10-07: qty 60, 30d supply, fill #1

## 2022-09-05 ENCOUNTER — Other Ambulatory Visit (HOSPITAL_BASED_OUTPATIENT_CLINIC_OR_DEPARTMENT_OTHER): Payer: Self-pay

## 2022-09-23 ENCOUNTER — Other Ambulatory Visit: Payer: Self-pay

## 2022-09-23 ENCOUNTER — Other Ambulatory Visit (HOSPITAL_BASED_OUTPATIENT_CLINIC_OR_DEPARTMENT_OTHER): Payer: Self-pay

## 2022-10-07 ENCOUNTER — Other Ambulatory Visit (HOSPITAL_BASED_OUTPATIENT_CLINIC_OR_DEPARTMENT_OTHER): Payer: Self-pay

## 2022-10-14 ENCOUNTER — Other Ambulatory Visit (HOSPITAL_BASED_OUTPATIENT_CLINIC_OR_DEPARTMENT_OTHER): Payer: Self-pay

## 2022-10-14 MED ORDER — AMLODIPINE BESYLATE 5 MG PO TABS
5.0000 mg | ORAL_TABLET | Freq: Every morning | ORAL | 3 refills | Status: AC
  Filled 2022-10-14: qty 90, 90d supply, fill #0
  Filled 2022-10-21: qty 25, 25d supply, fill #0
  Filled 2022-11-11 – 2022-11-29 (×3): qty 90, 90d supply, fill #1
  Filled 2023-02-24: qty 90, 90d supply, fill #2
  Filled 2023-05-26: qty 90, 90d supply, fill #3

## 2022-10-14 MED ORDER — ATORVASTATIN CALCIUM 80 MG PO TABS
80.0000 mg | ORAL_TABLET | Freq: Every day | ORAL | 3 refills | Status: AC
  Filled 2022-10-14: qty 90, 90d supply, fill #0
  Filled 2023-01-20 – 2023-01-31 (×2): qty 90, 90d supply, fill #1
  Filled 2023-04-24: qty 90, 90d supply, fill #2
  Filled 2023-07-23: qty 90, 90d supply, fill #3

## 2022-10-14 MED ORDER — ELIQUIS 5 MG PO TABS
5.0000 mg | ORAL_TABLET | Freq: Two times a day (BID) | ORAL | 3 refills | Status: DC
  Filled 2022-10-14: qty 180, 90d supply, fill #0
  Filled 2022-10-21: qty 142, 71d supply, fill #0
  Filled 2022-12-26 – 2023-01-08 (×2): qty 180, 90d supply, fill #1
  Filled 2023-04-09: qty 180, 90d supply, fill #2
  Filled 2023-07-08 – 2023-07-11 (×4): qty 180, 90d supply, fill #3

## 2022-10-14 MED ORDER — LOSARTAN POTASSIUM-HCTZ 100-25 MG PO TABS
1.0000 | ORAL_TABLET | Freq: Every day | ORAL | 3 refills | Status: AC
  Filled 2022-10-14: qty 90, 90d supply, fill #0
  Filled 2022-10-21: qty 4, 4d supply, fill #1
  Filled 2023-04-09: qty 90, 90d supply, fill #2
  Filled 2023-07-08: qty 90, 90d supply, fill #3
  Filled 2023-10-06: qty 86, 86d supply, fill #4

## 2022-10-14 MED ORDER — TRAZODONE HCL 100 MG PO TABS
100.0000 mg | ORAL_TABLET | Freq: Every evening | ORAL | 3 refills | Status: DC
  Filled 2022-10-14: qty 30, 30d supply, fill #0
  Filled 2022-10-21: qty 64, 64d supply, fill #0
  Filled 2022-12-19: qty 30, 30d supply, fill #1
  Filled 2023-01-10 – 2023-01-13 (×3): qty 26, 26d supply, fill #2

## 2022-10-14 MED ORDER — METOPROLOL SUCCINATE ER 25 MG PO TB24
25.0000 mg | ORAL_TABLET | Freq: Every day | ORAL | 3 refills | Status: AC
  Filled 2022-10-14: qty 90, 90d supply, fill #0
  Filled 2022-10-14: qty 30, 30d supply, fill #0
  Filled 2022-10-21: qty 4, 4d supply, fill #1
  Filled 2023-01-09: qty 90, 90d supply, fill #2
  Filled 2023-04-09: qty 90, 90d supply, fill #3
  Filled 2023-07-08: qty 86, 86d supply, fill #4

## 2022-10-21 ENCOUNTER — Other Ambulatory Visit (HOSPITAL_BASED_OUTPATIENT_CLINIC_OR_DEPARTMENT_OTHER): Payer: Self-pay

## 2022-10-21 MED ORDER — LORAZEPAM 0.5 MG PO TABS
0.5000 mg | ORAL_TABLET | Freq: Every day | ORAL | 0 refills | Status: AC | PRN
  Filled 2022-10-21: qty 30, 30d supply, fill #0

## 2022-10-23 ENCOUNTER — Other Ambulatory Visit (HOSPITAL_BASED_OUTPATIENT_CLINIC_OR_DEPARTMENT_OTHER): Payer: Self-pay

## 2022-10-25 ENCOUNTER — Other Ambulatory Visit (HOSPITAL_BASED_OUTPATIENT_CLINIC_OR_DEPARTMENT_OTHER): Payer: Self-pay

## 2022-10-28 ENCOUNTER — Other Ambulatory Visit (HOSPITAL_BASED_OUTPATIENT_CLINIC_OR_DEPARTMENT_OTHER): Payer: Self-pay

## 2022-11-19 ENCOUNTER — Other Ambulatory Visit (HOSPITAL_BASED_OUTPATIENT_CLINIC_OR_DEPARTMENT_OTHER): Payer: Self-pay

## 2022-11-28 ENCOUNTER — Other Ambulatory Visit (HOSPITAL_BASED_OUTPATIENT_CLINIC_OR_DEPARTMENT_OTHER): Payer: Self-pay

## 2022-12-02 ENCOUNTER — Other Ambulatory Visit (HOSPITAL_BASED_OUTPATIENT_CLINIC_OR_DEPARTMENT_OTHER): Payer: Self-pay

## 2022-12-07 ENCOUNTER — Other Ambulatory Visit (HOSPITAL_BASED_OUTPATIENT_CLINIC_OR_DEPARTMENT_OTHER): Payer: Self-pay

## 2022-12-07 MED ORDER — HYDROCODONE-ACETAMINOPHEN 5-325 MG PO TABS
1.0000 | ORAL_TABLET | Freq: Four times a day (QID) | ORAL | 0 refills | Status: AC | PRN
  Filled 2022-12-07: qty 12, 3d supply, fill #0

## 2022-12-09 ENCOUNTER — Other Ambulatory Visit (HOSPITAL_BASED_OUTPATIENT_CLINIC_OR_DEPARTMENT_OTHER): Payer: Self-pay

## 2022-12-23 ENCOUNTER — Other Ambulatory Visit (HOSPITAL_BASED_OUTPATIENT_CLINIC_OR_DEPARTMENT_OTHER): Payer: Self-pay

## 2023-01-07 ENCOUNTER — Other Ambulatory Visit (HOSPITAL_BASED_OUTPATIENT_CLINIC_OR_DEPARTMENT_OTHER): Payer: Self-pay

## 2023-01-09 ENCOUNTER — Other Ambulatory Visit: Payer: Self-pay

## 2023-01-09 ENCOUNTER — Other Ambulatory Visit (HOSPITAL_BASED_OUTPATIENT_CLINIC_OR_DEPARTMENT_OTHER): Payer: Self-pay

## 2023-01-10 ENCOUNTER — Other Ambulatory Visit (HOSPITAL_BASED_OUTPATIENT_CLINIC_OR_DEPARTMENT_OTHER): Payer: Self-pay

## 2023-01-13 ENCOUNTER — Other Ambulatory Visit (HOSPITAL_BASED_OUTPATIENT_CLINIC_OR_DEPARTMENT_OTHER): Payer: Self-pay

## 2023-01-31 ENCOUNTER — Other Ambulatory Visit (HOSPITAL_BASED_OUTPATIENT_CLINIC_OR_DEPARTMENT_OTHER): Payer: Self-pay

## 2023-02-04 ENCOUNTER — Other Ambulatory Visit (HOSPITAL_BASED_OUTPATIENT_CLINIC_OR_DEPARTMENT_OTHER): Payer: Self-pay

## 2023-02-04 MED ORDER — TRAZODONE HCL 100 MG PO TABS
100.0000 mg | ORAL_TABLET | Freq: Every evening | ORAL | 3 refills | Status: AC
  Filled 2023-02-04: qty 30, 30d supply, fill #0
  Filled 2023-03-06: qty 30, 30d supply, fill #1
  Filled 2023-03-29: qty 30, 30d supply, fill #2
  Filled 2023-05-05: qty 30, 30d supply, fill #3

## 2023-02-07 ENCOUNTER — Other Ambulatory Visit (HOSPITAL_BASED_OUTPATIENT_CLINIC_OR_DEPARTMENT_OTHER): Payer: Self-pay

## 2023-02-21 ENCOUNTER — Other Ambulatory Visit (HOSPITAL_BASED_OUTPATIENT_CLINIC_OR_DEPARTMENT_OTHER): Payer: Self-pay

## 2023-02-21 MED ORDER — METHIMAZOLE 5 MG PO TABS
2.5000 mg | ORAL_TABLET | Freq: Every day | ORAL | 3 refills | Status: DC
  Filled 2023-02-21: qty 27, 54d supply, fill #0
  Filled 2023-04-09: qty 45, 90d supply, fill #1
  Filled 2023-07-08: qty 45, 90d supply, fill #2
  Filled 2023-10-06: qty 45, 90d supply, fill #3

## 2023-03-26 ENCOUNTER — Other Ambulatory Visit (HOSPITAL_BASED_OUTPATIENT_CLINIC_OR_DEPARTMENT_OTHER): Payer: Self-pay

## 2023-03-26 MED ORDER — LINZESS 72 MCG PO CAPS
72.0000 ug | ORAL_CAPSULE | Freq: Every day | ORAL | 1 refills | Status: AC
  Filled 2023-03-26: qty 90, 90d supply, fill #0
  Filled 2023-06-18: qty 90, 90d supply, fill #1

## 2023-03-26 MED ORDER — AMLODIPINE BESYLATE 5 MG PO TABS
5.0000 mg | ORAL_TABLET | Freq: Every morning | ORAL | 1 refills | Status: DC
  Filled 2023-03-27 – 2023-04-09 (×10): qty 90, 90d supply, fill #0

## 2023-03-26 MED ORDER — ATORVASTATIN CALCIUM 80 MG PO TABS
80.0000 mg | ORAL_TABLET | Freq: Every day | ORAL | 1 refills | Status: AC
  Filled 2023-10-21: qty 90, 90d supply, fill #0
  Filled 2024-01-19: qty 90, 90d supply, fill #1

## 2023-03-26 MED ORDER — LOSARTAN POTASSIUM-HCTZ 100-25 MG PO TABS
1.0000 | ORAL_TABLET | Freq: Every day | ORAL | 1 refills | Status: AC
  Filled 2023-12-31: qty 90, 90d supply, fill #0

## 2023-03-26 MED ORDER — TRAZODONE HCL 100 MG PO TABS
100.0000 mg | ORAL_TABLET | Freq: Every day | ORAL | 1 refills | Status: AC
  Filled 2023-03-27 – 2023-04-09 (×10): qty 90, 90d supply, fill #0

## 2023-03-26 MED ORDER — LORAZEPAM 0.5 MG PO TABS
0.5000 mg | ORAL_TABLET | Freq: Every day | ORAL | 0 refills | Status: AC | PRN
  Filled 2023-03-26: qty 30, 30d supply, fill #0

## 2023-03-27 ENCOUNTER — Other Ambulatory Visit (HOSPITAL_BASED_OUTPATIENT_CLINIC_OR_DEPARTMENT_OTHER): Payer: Self-pay

## 2023-03-28 ENCOUNTER — Other Ambulatory Visit (HOSPITAL_BASED_OUTPATIENT_CLINIC_OR_DEPARTMENT_OTHER): Payer: Self-pay

## 2023-03-31 ENCOUNTER — Other Ambulatory Visit (HOSPITAL_BASED_OUTPATIENT_CLINIC_OR_DEPARTMENT_OTHER): Payer: Self-pay

## 2023-04-01 ENCOUNTER — Other Ambulatory Visit: Payer: Self-pay

## 2023-04-02 ENCOUNTER — Other Ambulatory Visit (HOSPITAL_BASED_OUTPATIENT_CLINIC_OR_DEPARTMENT_OTHER): Payer: Self-pay

## 2023-04-03 ENCOUNTER — Other Ambulatory Visit (HOSPITAL_BASED_OUTPATIENT_CLINIC_OR_DEPARTMENT_OTHER): Payer: Self-pay

## 2023-04-04 ENCOUNTER — Other Ambulatory Visit (HOSPITAL_BASED_OUTPATIENT_CLINIC_OR_DEPARTMENT_OTHER): Payer: Self-pay

## 2023-04-07 ENCOUNTER — Other Ambulatory Visit (HOSPITAL_BASED_OUTPATIENT_CLINIC_OR_DEPARTMENT_OTHER): Payer: Self-pay

## 2023-04-08 ENCOUNTER — Other Ambulatory Visit (HOSPITAL_BASED_OUTPATIENT_CLINIC_OR_DEPARTMENT_OTHER): Payer: Self-pay

## 2023-04-08 ENCOUNTER — Other Ambulatory Visit (HOSPITAL_COMMUNITY): Payer: Self-pay

## 2023-04-09 ENCOUNTER — Other Ambulatory Visit: Payer: Self-pay

## 2023-04-09 ENCOUNTER — Other Ambulatory Visit (HOSPITAL_BASED_OUTPATIENT_CLINIC_OR_DEPARTMENT_OTHER): Payer: Self-pay

## 2023-05-05 ENCOUNTER — Other Ambulatory Visit: Payer: Self-pay

## 2023-05-05 ENCOUNTER — Other Ambulatory Visit (HOSPITAL_BASED_OUTPATIENT_CLINIC_OR_DEPARTMENT_OTHER): Payer: Self-pay

## 2023-05-05 MED ORDER — ELIQUIS 5 MG PO TABS
5.0000 mg | ORAL_TABLET | Freq: Two times a day (BID) | ORAL | 3 refills | Status: AC
  Filled 2023-05-05: qty 142, 71d supply, fill #0
  Filled 2023-05-06 – 2023-07-01 (×6): qty 180, 90d supply, fill #0
  Filled 2023-10-06: qty 180, 90d supply, fill #1
  Filled 2024-01-05: qty 180, 90d supply, fill #2
  Filled 2024-04-05: qty 180, 90d supply, fill #3

## 2023-05-05 MED ORDER — ATORVASTATIN CALCIUM 80 MG PO TABS
80.0000 mg | ORAL_TABLET | Freq: Every day | ORAL | 3 refills | Status: DC
  Filled 2023-05-05: qty 71, 71d supply, fill #0
  Filled 2024-04-19: qty 90, 90d supply, fill #0

## 2023-05-05 MED ORDER — AMLODIPINE BESYLATE 10 MG PO TABS
10.0000 mg | ORAL_TABLET | Freq: Every morning | ORAL | 3 refills | Status: DC
  Filled 2023-05-05: qty 71, 71d supply, fill #0
  Filled 2023-07-08: qty 90, 90d supply, fill #1
  Filled 2023-10-06: qty 90, 90d supply, fill #2

## 2023-05-05 MED ORDER — LOSARTAN POTASSIUM-HCTZ 100-25 MG PO TABS
1.0000 | ORAL_TABLET | Freq: Every day | ORAL | 3 refills | Status: AC
  Filled 2023-05-05: qty 71, 71d supply, fill #0
  Filled 2024-03-30: qty 90, 90d supply, fill #0

## 2023-05-05 MED ORDER — METOPROLOL SUCCINATE ER 25 MG PO TB24
25.0000 mg | ORAL_TABLET | Freq: Every day | ORAL | 3 refills | Status: AC
  Filled 2023-05-05: qty 71, 71d supply, fill #0
  Filled 2023-10-02: qty 90, 90d supply, fill #0
  Filled 2024-01-05: qty 90, 90d supply, fill #1
  Filled 2024-04-05: qty 90, 90d supply, fill #2

## 2023-05-06 ENCOUNTER — Other Ambulatory Visit: Payer: Self-pay

## 2023-05-07 ENCOUNTER — Other Ambulatory Visit (HOSPITAL_BASED_OUTPATIENT_CLINIC_OR_DEPARTMENT_OTHER): Payer: Self-pay

## 2023-05-08 ENCOUNTER — Other Ambulatory Visit (HOSPITAL_BASED_OUTPATIENT_CLINIC_OR_DEPARTMENT_OTHER): Payer: Self-pay

## 2023-05-09 ENCOUNTER — Other Ambulatory Visit (HOSPITAL_BASED_OUTPATIENT_CLINIC_OR_DEPARTMENT_OTHER): Payer: Self-pay

## 2023-05-12 ENCOUNTER — Other Ambulatory Visit (HOSPITAL_BASED_OUTPATIENT_CLINIC_OR_DEPARTMENT_OTHER): Payer: Self-pay

## 2023-06-04 ENCOUNTER — Other Ambulatory Visit (HOSPITAL_BASED_OUTPATIENT_CLINIC_OR_DEPARTMENT_OTHER): Payer: Self-pay

## 2023-07-01 ENCOUNTER — Other Ambulatory Visit: Payer: Self-pay

## 2023-07-01 ENCOUNTER — Other Ambulatory Visit (HOSPITAL_BASED_OUTPATIENT_CLINIC_OR_DEPARTMENT_OTHER): Payer: Self-pay

## 2023-07-04 ENCOUNTER — Other Ambulatory Visit (HOSPITAL_BASED_OUTPATIENT_CLINIC_OR_DEPARTMENT_OTHER): Payer: Self-pay

## 2023-07-08 ENCOUNTER — Other Ambulatory Visit (HOSPITAL_BASED_OUTPATIENT_CLINIC_OR_DEPARTMENT_OTHER): Payer: Self-pay

## 2023-07-09 ENCOUNTER — Other Ambulatory Visit (HOSPITAL_BASED_OUTPATIENT_CLINIC_OR_DEPARTMENT_OTHER): Payer: Self-pay

## 2023-07-10 ENCOUNTER — Other Ambulatory Visit (HOSPITAL_BASED_OUTPATIENT_CLINIC_OR_DEPARTMENT_OTHER): Payer: Self-pay

## 2023-07-11 ENCOUNTER — Other Ambulatory Visit (HOSPITAL_BASED_OUTPATIENT_CLINIC_OR_DEPARTMENT_OTHER): Payer: Self-pay

## 2023-07-22 ENCOUNTER — Other Ambulatory Visit (HOSPITAL_BASED_OUTPATIENT_CLINIC_OR_DEPARTMENT_OTHER): Payer: Self-pay

## 2023-07-22 MED ORDER — TRAZODONE HCL 100 MG PO TABS
100.0000 mg | ORAL_TABLET | Freq: Every day | ORAL | 1 refills | Status: DC
  Filled 2023-07-22: qty 90, 90d supply, fill #0
  Filled 2023-10-06: qty 90, 90d supply, fill #1

## 2023-08-22 ENCOUNTER — Other Ambulatory Visit (HOSPITAL_BASED_OUTPATIENT_CLINIC_OR_DEPARTMENT_OTHER): Payer: Self-pay

## 2023-10-02 ENCOUNTER — Other Ambulatory Visit (HOSPITAL_BASED_OUTPATIENT_CLINIC_OR_DEPARTMENT_OTHER): Payer: Self-pay

## 2023-10-02 ENCOUNTER — Other Ambulatory Visit: Payer: Self-pay

## 2023-10-06 ENCOUNTER — Other Ambulatory Visit: Payer: Self-pay

## 2023-10-06 ENCOUNTER — Other Ambulatory Visit (HOSPITAL_BASED_OUTPATIENT_CLINIC_OR_DEPARTMENT_OTHER): Payer: Self-pay

## 2023-10-15 ENCOUNTER — Other Ambulatory Visit (HOSPITAL_BASED_OUTPATIENT_CLINIC_OR_DEPARTMENT_OTHER): Payer: Self-pay

## 2023-10-15 MED ORDER — APIXABAN 5 MG PO TABS: 5.0000 mg | ORAL_TABLET | Freq: Two times a day (BID) | ORAL | 3 refills | Status: AC

## 2023-10-16 ENCOUNTER — Other Ambulatory Visit (HOSPITAL_BASED_OUTPATIENT_CLINIC_OR_DEPARTMENT_OTHER): Payer: Self-pay

## 2023-10-21 ENCOUNTER — Other Ambulatory Visit: Payer: Self-pay

## 2023-10-21 ENCOUNTER — Other Ambulatory Visit (HOSPITAL_BASED_OUTPATIENT_CLINIC_OR_DEPARTMENT_OTHER): Payer: Self-pay

## 2023-10-29 ENCOUNTER — Other Ambulatory Visit (HOSPITAL_BASED_OUTPATIENT_CLINIC_OR_DEPARTMENT_OTHER): Payer: Self-pay

## 2023-10-30 ENCOUNTER — Other Ambulatory Visit (HOSPITAL_BASED_OUTPATIENT_CLINIC_OR_DEPARTMENT_OTHER): Payer: Self-pay

## 2023-10-30 MED ORDER — AMLODIPINE BESYLATE 10 MG PO TABS
10.0000 mg | ORAL_TABLET | Freq: Every morning | ORAL | 3 refills | Status: AC
  Filled 2023-10-30 – 2023-12-15 (×8): qty 90, 90d supply, fill #0
  Filled 2024-03-11 – 2024-03-23 (×2): qty 90, 90d supply, fill #1
  Filled 2024-06-22: qty 90, 90d supply, fill #2
  Filled 2024-09-20 – 2024-10-08 (×2): qty 90, 90d supply, fill #3

## 2023-10-31 ENCOUNTER — Other Ambulatory Visit: Payer: Self-pay

## 2023-10-31 ENCOUNTER — Other Ambulatory Visit (HOSPITAL_BASED_OUTPATIENT_CLINIC_OR_DEPARTMENT_OTHER): Payer: Self-pay

## 2023-11-03 ENCOUNTER — Other Ambulatory Visit (HOSPITAL_BASED_OUTPATIENT_CLINIC_OR_DEPARTMENT_OTHER): Payer: Self-pay

## 2023-11-03 DIAGNOSIS — K5909 Other constipation: Secondary | ICD-10-CM | POA: Diagnosis not present

## 2023-11-03 DIAGNOSIS — M170 Bilateral primary osteoarthritis of knee: Secondary | ICD-10-CM | POA: Diagnosis not present

## 2023-11-03 DIAGNOSIS — I1A0 Resistant hypertension: Secondary | ICD-10-CM | POA: Diagnosis not present

## 2023-11-03 DIAGNOSIS — M48061 Spinal stenosis, lumbar region without neurogenic claudication: Secondary | ICD-10-CM | POA: Diagnosis not present

## 2023-11-03 DIAGNOSIS — N1831 Chronic kidney disease, stage 3a: Secondary | ICD-10-CM | POA: Diagnosis not present

## 2023-11-03 MED ORDER — LINZESS 72 MCG PO CAPS
72.0000 ug | ORAL_CAPSULE | Freq: Every day | ORAL | 1 refills | Status: AC
  Filled 2023-11-03: qty 90, 90d supply, fill #0

## 2023-11-03 MED ORDER — SPIRONOLACTONE 25 MG PO TABS
25.0000 mg | ORAL_TABLET | Freq: Every day | ORAL | 0 refills | Status: AC
  Filled 2023-11-03: qty 30, 30d supply, fill #0

## 2023-11-04 ENCOUNTER — Other Ambulatory Visit (HOSPITAL_BASED_OUTPATIENT_CLINIC_OR_DEPARTMENT_OTHER): Payer: Self-pay

## 2023-11-05 ENCOUNTER — Other Ambulatory Visit (HOSPITAL_BASED_OUTPATIENT_CLINIC_OR_DEPARTMENT_OTHER): Payer: Self-pay

## 2023-11-06 ENCOUNTER — Other Ambulatory Visit (HOSPITAL_BASED_OUTPATIENT_CLINIC_OR_DEPARTMENT_OTHER): Payer: Self-pay

## 2023-11-07 ENCOUNTER — Other Ambulatory Visit (HOSPITAL_BASED_OUTPATIENT_CLINIC_OR_DEPARTMENT_OTHER): Payer: Self-pay

## 2023-11-26 ENCOUNTER — Other Ambulatory Visit (HOSPITAL_BASED_OUTPATIENT_CLINIC_OR_DEPARTMENT_OTHER): Payer: Self-pay

## 2023-12-15 ENCOUNTER — Other Ambulatory Visit (HOSPITAL_BASED_OUTPATIENT_CLINIC_OR_DEPARTMENT_OTHER): Payer: Self-pay

## 2023-12-31 ENCOUNTER — Other Ambulatory Visit (HOSPITAL_BASED_OUTPATIENT_CLINIC_OR_DEPARTMENT_OTHER): Payer: Self-pay

## 2024-01-05 ENCOUNTER — Other Ambulatory Visit: Payer: Self-pay

## 2024-01-05 ENCOUNTER — Other Ambulatory Visit (HOSPITAL_BASED_OUTPATIENT_CLINIC_OR_DEPARTMENT_OTHER): Payer: Self-pay

## 2024-01-07 ENCOUNTER — Other Ambulatory Visit (HOSPITAL_BASED_OUTPATIENT_CLINIC_OR_DEPARTMENT_OTHER): Payer: Self-pay

## 2024-01-07 MED ORDER — METHIMAZOLE 5 MG PO TABS
2.5000 mg | ORAL_TABLET | Freq: Every day | ORAL | 3 refills | Status: AC
  Filled 2024-01-07: qty 45, 90d supply, fill #0
  Filled 2024-04-05: qty 45, 90d supply, fill #1
  Filled 2024-05-25 – 2024-07-02 (×2): qty 45, 90d supply, fill #2
  Filled 2024-09-30 – 2024-10-08 (×2): qty 45, 90d supply, fill #3

## 2024-01-08 ENCOUNTER — Other Ambulatory Visit (HOSPITAL_BASED_OUTPATIENT_CLINIC_OR_DEPARTMENT_OTHER): Payer: Self-pay

## 2024-01-23 ENCOUNTER — Other Ambulatory Visit (HOSPITAL_BASED_OUTPATIENT_CLINIC_OR_DEPARTMENT_OTHER): Payer: Self-pay

## 2024-02-23 DIAGNOSIS — E0590 Thyrotoxicosis, unspecified without thyrotoxic crisis or storm: Secondary | ICD-10-CM | POA: Diagnosis not present

## 2024-02-23 DIAGNOSIS — E042 Nontoxic multinodular goiter: Secondary | ICD-10-CM | POA: Diagnosis not present

## 2024-02-24 ENCOUNTER — Other Ambulatory Visit (HOSPITAL_BASED_OUTPATIENT_CLINIC_OR_DEPARTMENT_OTHER): Payer: Self-pay

## 2024-02-24 MED ORDER — METHIMAZOLE 5 MG PO TABS
2.5000 mg | ORAL_TABLET | Freq: Every day | ORAL | 3 refills | Status: AC
  Filled 2024-02-24: qty 45, 90d supply, fill #0

## 2024-03-08 DIAGNOSIS — E042 Nontoxic multinodular goiter: Secondary | ICD-10-CM | POA: Diagnosis not present

## 2024-03-22 ENCOUNTER — Other Ambulatory Visit (HOSPITAL_BASED_OUTPATIENT_CLINIC_OR_DEPARTMENT_OTHER): Payer: Self-pay

## 2024-03-26 ENCOUNTER — Other Ambulatory Visit (HOSPITAL_BASED_OUTPATIENT_CLINIC_OR_DEPARTMENT_OTHER): Payer: Self-pay

## 2024-03-26 ENCOUNTER — Other Ambulatory Visit: Payer: Self-pay

## 2024-03-26 MED ORDER — TRAZODONE HCL 100 MG PO TABS
100.0000 mg | ORAL_TABLET | Freq: Every day | ORAL | 1 refills | Status: AC
  Filled 2024-03-26: qty 90, 90d supply, fill #0

## 2024-03-30 ENCOUNTER — Other Ambulatory Visit (HOSPITAL_BASED_OUTPATIENT_CLINIC_OR_DEPARTMENT_OTHER): Payer: Self-pay

## 2024-04-19 ENCOUNTER — Other Ambulatory Visit (HOSPITAL_BASED_OUTPATIENT_CLINIC_OR_DEPARTMENT_OTHER): Payer: Self-pay

## 2024-05-26 ENCOUNTER — Other Ambulatory Visit (HOSPITAL_BASED_OUTPATIENT_CLINIC_OR_DEPARTMENT_OTHER): Payer: Self-pay

## 2024-07-02 ENCOUNTER — Other Ambulatory Visit: Payer: Self-pay

## 2024-07-02 ENCOUNTER — Other Ambulatory Visit (HOSPITAL_BASED_OUTPATIENT_CLINIC_OR_DEPARTMENT_OTHER): Payer: Self-pay

## 2024-07-19 ENCOUNTER — Other Ambulatory Visit (HOSPITAL_BASED_OUTPATIENT_CLINIC_OR_DEPARTMENT_OTHER): Payer: Self-pay

## 2024-07-19 MED ORDER — ATORVASTATIN CALCIUM 80 MG PO TABS
80.0000 mg | ORAL_TABLET | Freq: Every day | ORAL | 3 refills | Status: AC
  Filled 2024-07-19: qty 90, 90d supply, fill #0

## 2024-08-06 ENCOUNTER — Other Ambulatory Visit (HOSPITAL_BASED_OUTPATIENT_CLINIC_OR_DEPARTMENT_OTHER): Payer: Self-pay

## 2024-10-07 ENCOUNTER — Other Ambulatory Visit (HOSPITAL_BASED_OUTPATIENT_CLINIC_OR_DEPARTMENT_OTHER): Payer: Self-pay
